# Patient Record
Sex: Male | Born: 1945 | Race: White | Hispanic: No | Smoking: Never smoker
Health system: Southern US, Community
[De-identification: ages and names within clinical notes are randomized; demographics above are authoritative.]

## PROBLEM LIST (undated history)

## (undated) DIAGNOSIS — G8929 Other chronic pain: Secondary | ICD-10-CM

## (undated) DIAGNOSIS — K219 Gastro-esophageal reflux disease without esophagitis: Secondary | ICD-10-CM

## (undated) DIAGNOSIS — C801 Malignant (primary) neoplasm, unspecified: Secondary | ICD-10-CM

## (undated) DIAGNOSIS — Z8781 Personal history of (healed) traumatic fracture: Secondary | ICD-10-CM

## (undated) DIAGNOSIS — N189 Chronic kidney disease, unspecified: Secondary | ICD-10-CM

## (undated) DIAGNOSIS — R972 Elevated prostate specific antigen [PSA]: Secondary | ICD-10-CM

## (undated) DIAGNOSIS — R001 Bradycardia, unspecified: Secondary | ICD-10-CM

## (undated) DIAGNOSIS — K5790 Diverticulosis of intestine, part unspecified, without perforation or abscess without bleeding: Secondary | ICD-10-CM

## (undated) DIAGNOSIS — M1990 Unspecified osteoarthritis, unspecified site: Secondary | ICD-10-CM

## (undated) DIAGNOSIS — M25559 Pain in unspecified hip: Secondary | ICD-10-CM

## (undated) DIAGNOSIS — N400 Enlarged prostate without lower urinary tract symptoms: Secondary | ICD-10-CM

## (undated) HISTORY — PX: PROSTATE BIOPSY: SHX241

## (undated) HISTORY — PX: COLONOSCOPY: SHX174

## (undated) HISTORY — PX: TONSILLECTOMY: SUR1361

---

## 2016-10-29 ENCOUNTER — Emergency Department (HOSPITAL_BASED_OUTPATIENT_CLINIC_OR_DEPARTMENT_OTHER): Payer: Medicare FFS

## 2016-10-29 ENCOUNTER — Emergency Department (HOSPITAL_BASED_OUTPATIENT_CLINIC_OR_DEPARTMENT_OTHER)
Admission: EM | Admit: 2016-10-29 | Discharge: 2016-10-29 | Disposition: A | Discharge status: Home / Self Care | Payer: Medicare FFS | Attending: Emergency Medicine | Admitting: Emergency Medicine

## 2016-10-29 ENCOUNTER — Encounter (HOSPITAL_BASED_OUTPATIENT_CLINIC_OR_DEPARTMENT_OTHER): Payer: Self-pay | Admitting: Emergency Medicine

## 2016-10-29 DIAGNOSIS — R0602 Shortness of breath: Secondary | ICD-10-CM | POA: Insufficient documentation

## 2016-10-29 DIAGNOSIS — R079 Chest pain, unspecified: Secondary | ICD-10-CM

## 2016-10-29 DIAGNOSIS — Z79899 Other long term (current) drug therapy: Secondary | ICD-10-CM | POA: Insufficient documentation

## 2016-10-29 DIAGNOSIS — R42 Dizziness and giddiness: Secondary | ICD-10-CM | POA: Diagnosis not present

## 2016-10-29 HISTORY — DX: Other chronic pain: G89.29

## 2016-10-29 HISTORY — DX: Pain in unspecified hip: M25.559

## 2016-10-29 LAB — CBC
HCT: 40.5 % (ref 39.0–52.0)
Hemoglobin: 14.4 g/dL (ref 13.0–17.0)
MCH: 32.4 pg (ref 26.0–34.0)
MCHC: 35.6 g/dL (ref 30.0–36.0)
MCV: 91.2 fL (ref 78.0–100.0)
PLATELETS: 283 10*3/uL (ref 150–400)
RBC: 4.44 MIL/uL (ref 4.22–5.81)
RDW: 12.1 % (ref 11.5–15.5)
WBC: 5.7 10*3/uL (ref 4.0–10.5)

## 2016-10-29 LAB — BASIC METABOLIC PANEL
Anion gap: 8 (ref 5–15)
BUN: 17 mg/dL (ref 6–20)
CALCIUM: 9 mg/dL (ref 8.9–10.3)
CO2: 25 mmol/L (ref 22–32)
CREATININE: 1.25 mg/dL — AB (ref 0.61–1.24)
Chloride: 105 mmol/L (ref 101–111)
GFR calc non Af Amer: 57 mL/min — ABNORMAL LOW (ref >60)
Glucose, Bld: 121 mg/dL — ABNORMAL HIGH (ref 65–99)
Potassium: 4.2 mmol/L (ref 3.5–5.1)
SODIUM: 138 mmol/L (ref 135–145)

## 2016-10-29 LAB — D-DIMER, QUANTITATIVE: D-Dimer, Quant: 0.56 ug/mL-FEU — ABNORMAL HIGH (ref 0.00–0.50)

## 2016-10-29 LAB — TROPONIN I: Troponin I: <0.03 ng/mL (ref <0.03)

## 2016-10-29 MED ORDER — IOPAMIDOL (ISOVUE-370) INJECTION 76%
100.0000 mL | Freq: Once | INTRAVENOUS | Status: AC | PRN
  Administered 2016-10-29: 100 mL via INTRAVENOUS

## 2016-10-29 NOTE — ED Triage Notes (Signed)
Patient states that he has has middle to lower back pain and hip pain chronically, but started to have chest pain to his left side under the nipple and SOB with an episode of Dizziness about 2 days ago

## 2016-10-29 NOTE — ED Notes (Signed)
Patient transported to CT 

## 2016-10-29 NOTE — ED Notes (Signed)
ED Provider at bedside. Discussed test results and plan of care to recheck Trop at 1715.

## 2016-10-30 NOTE — ED Provider Notes (Signed)
Received care of patient from Dr. Venora Maples at [facility]. Please see his note for prior history and physical. Briefly, this is a 70yo male who presents with concern for chest pain. Pain present with deep breaths, notices it when standing. No exertional CP history prior to this. No hx of cardiac disease.  ECG, initial troponin negative. DDimer elevated and pt with hx of travel from [facility] and CT PE study ordered and delta trop pending.   CT PE study negative. Delta troponin negative, doubt ACS. PT CP free. Will provide number for cardiology follow up here. Patient discharged in stable condition with understanding of reasons to return.      EKG Interpretation  Date/Time:  Monday October 29 2016 14:01:40 EST Ventricular Rate:  69 PR Interval:    QRS Duration: 92 QT Interval:  386 QTC Calculation: 414 R Axis:   -11 Text Interpretation:  Sinus rhythm Abnormal R-wave progression, early transition No old tracing to compare Confirmed by CAMPOS  MD, KEVIN ([id number]) on 10/29/2016 2:09:49 PM         Gareth Morgan, MD 10/30/16 1213

## 2016-10-30 NOTE — ED Provider Notes (Signed)
[facility] DEPT [facility] Provider Note   CSN: [id number] Arrival date & time: 10/29/16  1355     History   Chief Complaint Chief Complaint  Patient presents with  . Chest Pain    HPI Luke Becker is a 70 y.o. male.   Chest Pain     Patient presents to the emergency department with mild chest pain and pain under his left nipple.  He also reports some occasional shortness of breath and dizziness.  He just came from [place] and had a 61 Hour Dr.  No history of cardiac disease.  No prior history of DVT or pulmonary embolism.  He denies unilateral leg swelling.  No recent cough or congestion.  No fevers or chills.  No shortness of breath at rest.  He is always been active through his life.  He used to run marathons.  He does not currently have any exertional chest pain or shortness of breath   Past Medical History:  Diagnosis Date  . Chronic hip pain   . Prostate enlargement     There are no active problems to display for this patient.   Past Surgical History:  Procedure Laterality Date  . TONSILLECTOMY         Home Medications    Prior to Admission medications   Medication Sig Start Date End Date Taking? Authorizing Provider  doxazosin (CARDURA) 4 MG tablet Take 4 mg by mouth daily.   Yes Historical Provider, MD  finasteride (PROSCAR) 5 MG tablet Take 5 mg by mouth daily.   Yes Historical Provider, MD  meloxicam (MOBIC) 7.5 MG tablet Take 7.5 mg by mouth daily.   Yes Historical Provider, MD    Family History History reviewed. No pertinent family history.  Social History Social History  Substance Use Topics  . Smoking status: Never Smoker  . Smokeless tobacco: Never Used  . Alcohol use 1.2 oz/week    2 Glasses of wine per week     Allergies   Patient has no known allergies.   Review of Systems Review of Systems  Cardiovascular: Positive for chest pain.  All other systems reviewed and are negative.    Physical Exam Updated Vital Signs BP  132/82 (BP Location: Right Arm)   Pulse 62   Temp 98.2 F (36.8 C) (Oral)   Resp 16   Ht 5\' 9"  (1.753 m)   Wt 185 lb (83.9 kg)   SpO2 97%   BMI 27.32 kg/m   Physical Exam  Constitutional: He is oriented to person, place, and time. He appears well-developed and well-nourished.  HENT:  Head: Normocephalic and atraumatic.  Eyes: EOM are normal.  Neck: Normal range of motion.  Cardiovascular: Normal rate, regular rhythm, normal heart sounds and intact distal pulses.   Pulmonary/Chest: Effort normal and breath sounds normal. No respiratory distress.  Abdominal: Soft. He exhibits no distension. There is no tenderness.  Musculoskeletal: Normal range of motion.  Neurological: He is alert and oriented to person, place, and time.  Skin: Skin is warm and dry.  Psychiatric: He has a normal mood and affect. Judgment normal.  Nursing note and vitals reviewed.    ED Treatments / Results  Labs (all labs ordered are listed, but only abnormal results are displayed) Labs Reviewed  BASIC METABOLIC PANEL - Abnormal; Notable for the following:       Result Value   Glucose, Bld 121 (*)    Creatinine, Ser 1.25 (*)    GFR calc non Af Amer 57 (*)  All other components within normal limits  D-DIMER, QUANTITATIVE (NOT AT [facility]) - Abnormal; Notable for the following:    D-Dimer, Quant 0.56 (*)    All other components within normal limits  CBC  TROPONIN I  TROPONIN I    EKG  EKG Interpretation  Date/Time:  Monday October 29 2016 14:01:40 EST Ventricular Rate:  69 PR Interval:    QRS Duration: 92 QT Interval:  386 QTC Calculation: 414 R Axis:   -11 Text Interpretation:  Sinus rhythm Abnormal R-wave progression, early transition No old tracing to compare Confirmed by Clifton Kovacic  MD, Lennette Bihari ([id number]) on 10/29/2016 2:09:49 PM       Radiology Dg Chest 2 View  Result Date: 10/29/2016 CLINICAL DATA:  Initial evaluation for acute left-sided chest pain. EXAM: CHEST  2 VIEW COMPARISON:  None  available. FINDINGS: Cardiac and mediastinal silhouettes within normal limits. Lungs are normally inflated. No focal infiltrates, pulmonary edema, or pleural effusion. No acute osseus abnormality. Severe degenerative changes noted about the right shoulder. Degenerative spurring noted within the visualized spine. IMPRESSION: No active cardiopulmonary disease. Electronically Signed   By: Jeannine Boga M.D.   On: 10/29/2016 14:44     Procedures Procedures (including critical care time)  Medications Ordered in ED Medications  iopamidol (ISOVUE-370) 76 % injection 100 mL (100 mLs Intravenous Contrast Given 10/29/16 1551)     Initial Impression / Assessment and Plan / ED Course  I have reviewed the triage vital signs and the nursing notes.  Pertinent labs & imaging results that were available during my care of the patient were reviewed by me and considered in my medical decision making (see chart for details).  Clinical Course     Low suspicion for ACS.  EKG and troponin negative.  Patient will undergo CT imaging of his chest given his elevated d-dimer.  Care to Dr. Billy Fischer to follow up on second troponin and CTA chest  Final Clinical Impressions(s) / ED Diagnoses   Final diagnoses:  Chest pain, unspecified type    New Prescriptions Discharge Medication List as of 10/29/2016  6:21 PM       Jola Schmidt, MD 10/30/16 [phone]

## 2017-12-03 HISTORY — PX: CARPAL TUNNEL RELEASE: SHX101

## 2017-12-13 ENCOUNTER — Ambulatory Visit: Payer: Self-pay | Admitting: Orthopedic Surgery

## 2017-12-17 ENCOUNTER — Ambulatory Visit: Payer: Self-pay | Admitting: Orthopedic Surgery

## 2017-12-17 NOTE — H&P (View-Only) (Signed)
Luke Becker, LY [phone], M)  DOB 09/05/1946   Chief Complaint Right Hip Pain right THA scheduled 12/23/17  Patient's Care Team Primary Care Provider: [facility]): [address], [place], [place] [address], Ph [phone], Fax [phone]    Patient's Pharmacies [place] [address] [facility]): [address], [place] [address], Ph [phone], Fax [phone]   Vitals Ht: 5 ft 9 in 12/17/2017 10:45 am Wt: 180 lbs 12/17/2017 10:45 am BMI: 26.6 12/17/2017 10:45 am Pain Scale: 8 12/17/2017 10:47 am   Allergies Reviewed Allergies NKDA   Medications Reviewed Medications doxazosin 4 mg tablet  finasteride 5 mg tablet  IBU 600 mg tablet  pantoprazole 40 mg tablet,delayed release     Problems Reviewed Problems Osteoarthritis of hip    Family History Reviewed Family History Father - No current problems or disability Mother - No current problems or disability   Social History Reviewed Social History Smoking Status: Never smoker Alcohol intake: Occasional Hand Dominance: Right Work related injury?: N Advance directive: N Medical Power of Attorney: N   Surgical History Reviewed Surgical History Cataract Tonsillectomy - 12/03/1952   Past Medical History Reviewed Past Medical History Joint Pain: Y Osteoarthritis: Y Notes: Ventral Hernia, Umbilical Hernia, Left Rotator Cuff Tear    HPI Patient is a 72 year old male admitted for scheduled right total hip  Patient has been followed for his right hip. He reports 1 1/2 years of progressive and and dysfunction. He reports aching (groin and buttock). He reports pain level 8/10. He describes pain with weightbearing activity.  He has had progressively worsening problems with this RIGHT hip especially within the past 6 months. The hip is now hurting at all times and limiting what he can and cannot do. He has lost range of motion and is having pain day and night. He  is having significant functional difficulties also. He was told he has significant arthritis in the hip by an orthopedist in [place] and hip replacement was recommended. He has family here in [place] and is moving back here. He is not having any current problems with the LEFT hip. He is ready at this time to get th hip replaced. Risks and benefits have been discussed with the patient and he is ready to proceed with surgery.   ROS Constitutional: Constitutional: no significant weight gain or loss and no fever.  HEENT: Eyes: no irritation, dry eyes, vision change, or sore throat.  Cardiovascular: Cardiovascular: no palpitations or chest pain.  Respiratory: Respiratory: no cough or shortness of breath and No COPD.  Gastrointestinal: Gastrointestinal: no vomiting or diarrhea and not vomiting blood.  Genitourinary: Genitourinary: no blood in urine or difficulty urinating.  Musculoskeletal: Musculoskeletal: Joint Pain.  Neurologic: Neurologic: no numbness, seizures, dizziness, or difficulty with balance.     Physical Exam Patient is a 72 year old male.  General Mental Status - Alert, cooperative and good historian. General Appearance - pleasant, Not in acute distress. Orientation - Oriented X3. Build & Nutrition - Well nourished and Well developed.  Head and Neck Head - normocephalic, atraumatic . Neck Global Assessment - supple, no bruit auscultated on the right, no bruit auscultated on the left.  Eye Pupil - Bilateral - PERR Motion - Bilateral - EOMI.  Chest and Lung Exam Auscultation Breath sounds - clear at anterior chest wall and clear at posterior chest wall. Adventitious sounds - No Adventitious sounds.  Cardiovascular Auscultation Rhythm - Regular rate and rhythm. Heart Sounds - S1  WNL and S2 WNL. Murmurs & Other Heart Sounds - Auscultation of the heart reveals - No Murmurs.  Abdomen Palpation/Percussion Tenderness - Abdomen is non-tender to palpation. Abdomen  is soft. Auscultation Auscultation of the abdomen reveals - Bowel sounds normal.  Male Genitourinary Note: Not done, not pertinent to present illness  Musculoskeletal His LEFT hip can be flexed to 110, rotated and 20, out 30 and abduct to 30 with minimal discomfort. His RIGHT hip can be flexed to 100 with no internal or external rotation and only 10 of abduction. He has no trochanteric tenderness. He has a significantly antalgic gait pattern on the RIGHT. Pulses, sensation and motor are intact both lower extremities.  Radiographs-AP pelvis and lateral of the RIGHT hip show advanced end-stage bone-on-bone arthritis of the RIGHT hip with subchondral cystic formation. He has mild arthritis on the LEFT.   Assessment / Plan 1. 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 Jabier Gauss [facility])  IV TXA  Anesthesia Issues: None  Patient was instructed on what medications to stop prior to surgery.  Return to Office [name], MD for Post-Op at [facility] on 01/07/2018 at 02:15 PM  Encounter signed-off by Mickel Crow, PA-C

## 2017-12-17 NOTE — Patient Instructions (Signed)
Undrea Shipes  12/17/2017   Your procedure is scheduled on: 12/23/2017  Report to [facility] Main  Entrance  Report to admitting at   0800 AM   Call this number if you have problems the morning of surgery [phone]   Remember: Do not eat food or drink liquids :After Midnight.     Take these medicines the morning of surgery with A SIP OF WATER: Doxazosin, proscar, Protonix                                 You may not have any metal on your body including hair pins and              piercings  Do not wear jewelry, , lotions, powders or perfumes, deodorant                          Men may shave face and neck.   Do not bring valuables to the hospital. [place].  Contacts, dentures or bridgework may not be worn into surgery.  Leave suitcase in the car. After surgery it may be brought to your room.                       Please read over the following fact sheets you were given: _____________________________________________________________________             [facility] - Preparing for Surgery Before surgery, you can play an important role.  Because skin is not sterile, your skin needs to be as free of germs as possible.  You can reduce the number of germs on your skin by washing with CHG (chlorahexidine gluconate) soap before surgery.  CHG is an antiseptic cleaner which kills germs and bonds with the skin to continue killing germs even after washing. Please DO NOT use if you have an allergy to CHG or antibacterial soaps.  If your skin becomes reddened/irritated stop using the CHG and inform your nurse when you arrive at Short Stay. Do not shave (including legs and underarms) for at least 48 hours prior to the first CHG shower.  You may shave your face/neck. Please follow these instructions carefully:  1.  Shower with CHG Soap the night before surgery and the  morning of Surgery.  2.  If you choose to wash  your hair, wash your hair first as usual with your  normal  shampoo.  3.  After you shampoo, rinse your hair and body thoroughly to remove the  shampoo.                           4.  Use CHG as you would any other liquid soap.  You can apply chg directly  to the skin and wash                       Gently with a scrungie or clean washcloth.  5.  Apply the CHG Soap to your body ONLY FROM THE NECK DOWN.   Do not use on face/ open  Wound or open sores. Avoid contact with eyes, ears mouth and genitals (private parts).                       Wash face,  Genitals (private parts) with your normal soap.             6.  Wash thoroughly, paying special attention to the area where your surgery  will be performed.  7.  Thoroughly rinse your body with warm water from the neck down.  8.  DO NOT shower/wash with your normal soap after using and rinsing off  the CHG Soap.                9.  Pat yourself dry with a clean towel.            10.  Wear clean pajamas.            11.  Place clean sheets on your bed the night of your first shower and do not  sleep with pets. Day of Surgery : Do not apply any lotions/deodorants the morning of surgery.  Please wear clean clothes to the hospital/surgery center.  FAILURE TO FOLLOW THESE INSTRUCTIONS MAY RESULT IN THE CANCELLATION OF YOUR SURGERY PATIENT SIGNATURE_________________________________  NURSE SIGNATURE__________________________________  ________________________________________________________________________  WHAT IS A BLOOD TRANSFUSION? Blood Transfusion Information  A transfusion is the replacement of blood or some of its parts. Blood is made up of multiple cells which provide different functions.  Red blood cells carry oxygen and are used for blood loss replacement.  White blood cells fight against infection.  Platelets control bleeding.  Plasma helps clot blood.  Other blood products are available for specialized needs,  such as hemophilia or other clotting disorders. BEFORE THE TRANSFUSION  Who gives blood for transfusions?   Healthy volunteers who are fully evaluated to make sure their blood is safe. This is blood bank blood. Transfusion therapy is the safest it has ever been in the practice of medicine. Before blood is taken from a donor, a complete history is taken to make sure that person has no history of diseases nor engages in risky social behavior (examples are intravenous drug use or sexual activity with multiple partners). The donor's travel history is screened to minimize risk of transmitting infections, such as malaria. The donated blood is tested for signs of infectious diseases, such as HIV and hepatitis. The blood is then tested to be sure it is compatible with you in order to minimize the chance of a transfusion reaction. If you or a relative donates blood, this is often done in anticipation of surgery and is not appropriate for emergency situations. It takes many days to process the donated blood. RISKS AND COMPLICATIONS Although transfusion therapy is very safe and saves many lives, the main dangers of transfusion include:   Getting an infectious disease.  Developing a transfusion reaction. This is an allergic reaction to something in the blood you were given. Every precaution is taken to prevent this. The decision to have a blood transfusion has been considered carefully by your caregiver before blood is given. Blood is not given unless the benefits outweigh the risks. AFTER THE TRANSFUSION  Right after receiving a blood transfusion, you will usually feel much better and more energetic. This is especially true if your red blood cells have gotten low (anemic). The transfusion raises the level of the red blood cells which carry oxygen, and this usually causes an energy increase.  The  nurse administering the transfusion will monitor you carefully for complications. HOME CARE INSTRUCTIONS  No  special instructions are needed after a transfusion. You may find your energy is better. Speak with your caregiver about any limitations on activity for underlying diseases you may have. SEEK MEDICAL CARE IF:   Your condition is not improving after your transfusion.  You develop redness or irritation at the intravenous (IV) site. SEEK IMMEDIATE MEDICAL CARE IF:  Any of the following symptoms occur over the next 12 hours:  Shaking chills.  You have a temperature by mouth above 102 F (38.9 C), not controlled by medicine.  Chest, back, or muscle pain.  People around you feel you are not acting correctly or are confused.  Shortness of breath or difficulty breathing.  Dizziness and fainting.  You get a rash or develop hives.  You have a decrease in urine output.  Your urine turns a dark color or changes to pink, red, or brown. Any of the following symptoms occur over the next 10 days:  You have a temperature by mouth above 102 F (38.9 C), not controlled by medicine.  Shortness of breath.  Weakness after normal activity.  The white part of the eye turns yellow (jaundice).  You have a decrease in the amount of urine or are urinating less often.  Your urine turns a dark color or changes to pink, red, or brown. Document Released: 11/16/2000 Document Revised: 02/11/2012 Document Reviewed: 07/05/2008 ExitCare Patient Information 2014 [place].  _______________________________________________________________________  Incentive Spirometer  An incentive spirometer is a tool that can help keep your lungs clear and active. This tool measures how well you are filling your lungs with each breath. Taking long deep breaths may help reverse or decrease the chance of developing breathing (pulmonary) problems (especially infection) following:  A long period of time when you are unable to move or be active. BEFORE THE PROCEDURE   If the spirometer includes an indicator to show  your best effort, your nurse or respiratory therapist will set it to a desired goal.  If possible, sit up straight or lean slightly forward. Try not to slouch.  Hold the incentive spirometer in an upright position. INSTRUCTIONS FOR USE  1. Sit on the edge of your bed if possible, or sit up as far as you can in bed or on a chair. 2. Hold the incentive spirometer in an upright position. 3. Breathe out normally. 4. Place the mouthpiece in your mouth and seal your lips tightly around it. 5. Breathe in slowly and as deeply as possible, raising the piston or the ball toward the top of the column. 6. Hold your breath for 3-5 seconds or for as long as possible. Allow the piston or ball to fall to the bottom of the column. 7. Remove the mouthpiece from your mouth and breathe out normally. 8. Rest for a few seconds and repeat Steps 1 through 7 at least 10 times every 1-2 hours when you are awake. Take your time and take a few normal breaths between deep breaths. 9. The spirometer may include an indicator to show your best effort. Use the indicator as a goal to work toward during each repetition. 10. After each set of 10 deep breaths, practice coughing to be sure your lungs are clear. If you have an incision (the cut made at the time of surgery), support your incision when coughing by placing a pillow or rolled up towels firmly against it. Once you are able to get  out of bed, walk around indoors and cough well. You may stop using the incentive spirometer when instructed by your caregiver.  RISKS AND COMPLICATIONS  Take your time so you do not get dizzy or light-headed.  If you are in pain, you may need to take or ask for pain medication before doing incentive spirometry. It is harder to take a deep breath if you are having pain. AFTER USE  Rest and breathe slowly and easily.  It can be helpful to keep track of a log of your progress. Your caregiver can provide you with a simple table to help with  this. If you are using the spirometer at home, follow these instructions: [place] IF:   You are having difficultly using the spirometer.  You have trouble using the spirometer as often as instructed.  Your pain medication is not giving enough relief while using the spirometer.  You develop fever of 100.5 F (38.1 C) or higher. SEEK IMMEDIATE MEDICAL CARE IF:   You cough up bloody sputum that had not been present before.  You develop fever of 102 F (38.9 C) or greater.  You develop worsening pain at or near the incision site. MAKE SURE YOU:   Understand these instructions.  Will watch your condition.  Will get help right away if you are not doing well or get worse. Document Released: 04/01/2007 Document Revised: 02/11/2012 Document Reviewed: 06/02/2007 [facility] Patient Information 2014 [place], [place].   ________________________________________________________________________

## 2017-12-17 NOTE — H&P (Signed)
Luke Becker, Luke Becker [phone], M)  DOB 02-05-1946   Chief Complaint Right Hip Pain right THA scheduled 12/23/17  Patient's Care Team Primary Care Provider: [facility]): [place], [place], [place] [address], Ph [phone], Fax [phone]    Patient's Pharmacies [place] [address] [facility]): [address], [place] [address], Ph [phone], Fax [phone]   Vitals Ht: 5 ft 9 in 12/17/2017 10:45 am Wt: 180 lbs 12/17/2017 10:45 am BMI: 26.6 12/17/2017 10:45 am Pain Scale: 8 12/17/2017 10:47 am   Allergies Reviewed Allergies NKDA   Medications Reviewed Medications doxazosin 4 mg tablet  finasteride 5 mg tablet  IBU 600 mg tablet  pantoprazole 40 mg tablet,delayed release     Problems Reviewed Problems Osteoarthritis of hip    Family History Reviewed Family History Father - No current problems or disability Mother - No current problems or disability   Social History Reviewed Social History Smoking Status: Never smoker Alcohol intake: Occasional Hand Dominance: Right Work related injury?: N Advance directive: N Medical Power of Attorney: N   Surgical History Reviewed Surgical History Cataract Tonsillectomy - 12/03/1952   Past Medical History Reviewed Past Medical History Joint Pain: Y Osteoarthritis: Y Notes: Ventral Hernia, Umbilical Hernia, Left Rotator Cuff Tear    HPI Patient is a 72 year old male admitted for scheduled right total hip  Patient has been followed for his right hip. He reports 1 1/2 years of progressive and and dysfunction. He reports aching (groin and buttock). He reports pain level 8/10. He describes pain with weightbearing activity.  He has had progressively worsening problems with this RIGHT hip especially within the past 6 months. The hip is now hurting at all times and limiting what he can and cannot do. He has lost range of motion and is having pain day and night. He  is having significant functional difficulties also. He was told he has significant arthritis in the hip by an orthopedist in [place] and hip replacement was recommended. He has family here in [place] and is moving back here. He is not having any current problems with the LEFT hip. He is ready at this time to get th hip replaced. Risks and benefits have been discussed with the patient and he is ready to proceed with surgery.   ROS Constitutional: Constitutional: no significant weight gain or loss and no fever.  HEENT: Eyes: no irritation, dry eyes, vision change, or sore throat.  Cardiovascular: Cardiovascular: no palpitations or chest pain.  Respiratory: Respiratory: no cough or shortness of breath and No COPD.  Gastrointestinal: Gastrointestinal: no vomiting or diarrhea and not vomiting blood.  Genitourinary: Genitourinary: no blood in urine or difficulty urinating.  Musculoskeletal: Musculoskeletal: Joint Pain.  Neurologic: Neurologic: no numbness, seizures, dizziness, or difficulty with balance.     Physical Exam Patient is a 72 year old male.  General Mental Status - Alert, cooperative and good historian. General Appearance - pleasant, Not in acute distress. Orientation - Oriented X3. Build & Nutrition - Well nourished and Well developed.  Head and Neck Head - normocephalic, atraumatic . Neck Global Assessment - supple, no bruit auscultated on the right, no bruit auscultated on the left.  Eye Pupil - Bilateral - PERR Motion - Bilateral - EOMI.  Chest and Lung Exam Auscultation Breath sounds - clear at anterior chest wall and clear at posterior chest wall. Adventitious sounds - No Adventitious sounds.  Cardiovascular Auscultation Rhythm - Regular rate and rhythm. Heart Sounds - S1  WNL and S2 WNL. Murmurs & Other Heart Sounds - Auscultation of the heart reveals - No Murmurs.  Abdomen Palpation/Percussion Tenderness - Abdomen is non-tender to palpation. Abdomen  is soft. Auscultation Auscultation of the abdomen reveals - Bowel sounds normal.  Male Genitourinary Note: Not done, not pertinent to present illness  Musculoskeletal His LEFT hip can be flexed to 110, rotated and 20, out 30 and abduct to 30 with minimal discomfort. His RIGHT hip can be flexed to 100 with no internal or external rotation and only 10 of abduction. He has no trochanteric tenderness. He has a significantly antalgic gait pattern on the RIGHT. Pulses, sensation and motor are intact both lower extremities.  Radiographs-AP pelvis and lateral of the RIGHT hip show advanced end-stage bone-on-bone arthritis of the RIGHT hip with subchondral cystic formation. He has mild arthritis on the LEFT.   Assessment / Plan 1. 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 Jabier Gauss [facility])  IV TXA  Anesthesia Issues: None  Patient was instructed on what medications to stop prior to surgery.  Return to Office [name], MD for Post-Op at [facility] on 01/07/2018 at 02:15 PM  Encounter signed-off by Mickel Crow, PA-C

## 2017-12-18 ENCOUNTER — Encounter (HOSPITAL_COMMUNITY): Payer: Self-pay

## 2017-12-18 ENCOUNTER — Other Ambulatory Visit: Payer: Self-pay

## 2017-12-18 ENCOUNTER — Encounter (INDEPENDENT_AMBULATORY_CARE_PROVIDER_SITE_OTHER): Payer: Self-pay

## 2017-12-18 ENCOUNTER — Encounter (HOSPITAL_COMMUNITY)
Admission: RE | Admit: 2017-12-18 | Discharge: 2017-12-18 | Disposition: A | Discharge status: Home / Self Care | Payer: Medicare PPO | Source: Ambulatory Visit | Attending: Orthopedic Surgery | Admitting: Orthopedic Surgery

## 2017-12-18 DIAGNOSIS — Z01812 Encounter for preprocedural laboratory examination: Secondary | ICD-10-CM | POA: Insufficient documentation

## 2017-12-18 DIAGNOSIS — M1611 Unilateral primary osteoarthritis, right hip: Secondary | ICD-10-CM | POA: Insufficient documentation

## 2017-12-18 LAB — SURGICAL PCR SCREEN
MRSA, PCR: NEGATIVE
STAPHYLOCOCCUS AUREUS: POSITIVE — AB

## 2017-12-18 LAB — PROTIME-INR
INR: 1.05
PROTHROMBIN TIME: 13.6 s (ref 11.4–15.2)

## 2017-12-18 LAB — APTT: aPTT: 36 seconds (ref 24–36)

## 2017-12-18 LAB — ABO/RH: ABO/RH(D): O POS

## 2017-12-18 NOTE — Progress Notes (Signed)
LOV-12/06/2017-with DR Williemae Natter on chart  CMP-12/13/17 on chart  CBc/DIFF-12/13/17-on chart  EKG-12/13/17-on chart

## 2017-12-22 MED ORDER — TRANEXAMIC ACID 1000 MG/10ML IV SOLN
1000.0000 mg | INTRAVENOUS | Status: AC
  Administered 2017-12-23: 1000 mg via INTRAVENOUS
  Filled 2017-12-22: qty 1100

## 2017-12-22 NOTE — Anesthesia Preprocedure Evaluation (Addendum)
Anesthesia Evaluation  Patient identified by MRN, date of birth, ID band Patient awake    Reviewed: Allergy & Precautions, NPO status , Patient's Chart, lab work & pertinent test results  Airway Mallampati: II  TM Distance: >3 FB Neck ROM: Full    Dental no notable dental hx.    Pulmonary neg pulmonary ROS,    Pulmonary exam normal breath sounds clear to auscultation       Cardiovascular negative cardio ROS Normal cardiovascular exam Rhythm:Regular Rate:Normal     Neuro/Psych negative neurological ROS  negative psych ROS   GI/Hepatic negative GI ROS, Neg liver ROS, GERD  Medicated,  Endo/Other  negative endocrine ROS  Renal/GU negative Renal ROS  negative genitourinary   Musculoskeletal negative musculoskeletal ROS (+)   Abdominal   Peds negative pediatric ROS (+)  Hematology negative hematology ROS (+)   Anesthesia Other Findings   Reproductive/Obstetrics negative OB ROS                            Anesthesia Physical Anesthesia Plan  ASA: II  Anesthesia Plan: Spinal   Post-op Pain Management:    Induction:   PONV Risk Score and Plan: 1 and Treatment may vary due to age or medical condition and Propofol infusion  Airway Management Planned: Nasal Cannula and Natural Airway  Additional Equipment:   Intra-op Plan:   Post-operative Plan:   Informed Consent: I have reviewed the patients History and Physical, chart, labs and discussed the procedure including the risks, benefits and alternatives for the proposed anesthesia with the patient or authorized representative who has indicated his/her understanding and acceptance.     Plan Discussed with: CRNA and Anesthesiologist  Anesthesia Plan Comments: (  )        Anesthesia Quick Evaluation

## 2017-12-23 ENCOUNTER — Inpatient Hospital Stay (HOSPITAL_COMMUNITY): Payer: Medicare PPO | Admitting: Anesthesiology

## 2017-12-23 ENCOUNTER — Inpatient Hospital Stay (HOSPITAL_COMMUNITY): Payer: Medicare PPO

## 2017-12-23 ENCOUNTER — Encounter (HOSPITAL_COMMUNITY): Payer: Self-pay

## 2017-12-23 ENCOUNTER — Other Ambulatory Visit: Payer: Self-pay

## 2017-12-23 ENCOUNTER — Inpatient Hospital Stay (HOSPITAL_COMMUNITY)
Admission: RE | Admit: 2017-12-23 | Discharge: 2017-12-25 | DRG: 470 | Disposition: A | Discharge status: Home Health | Payer: Medicare PPO | Source: Ambulatory Visit | Attending: Orthopedic Surgery | Admitting: Orthopedic Surgery

## 2017-12-23 ENCOUNTER — Encounter (HOSPITAL_COMMUNITY)
Admission: RE | Disposition: A | Discharge status: Home Health | Payer: Self-pay | Source: Ambulatory Visit | Attending: Orthopedic Surgery

## 2017-12-23 DIAGNOSIS — K219 Gastro-esophageal reflux disease without esophagitis: Secondary | ICD-10-CM | POA: Diagnosis present

## 2017-12-23 DIAGNOSIS — N400 Enlarged prostate without lower urinary tract symptoms: Secondary | ICD-10-CM | POA: Diagnosis present

## 2017-12-23 DIAGNOSIS — Z881 Allergy status to other antibiotic agents status: Secondary | ICD-10-CM

## 2017-12-23 DIAGNOSIS — M1611 Unilateral primary osteoarthritis, right hip: Secondary | ICD-10-CM | POA: Diagnosis present

## 2017-12-23 DIAGNOSIS — Z96649 Presence of unspecified artificial hip joint: Secondary | ICD-10-CM

## 2017-12-23 DIAGNOSIS — M169 Osteoarthritis of hip, unspecified: Secondary | ICD-10-CM | POA: Diagnosis present

## 2017-12-23 DIAGNOSIS — M25551 Pain in right hip: Secondary | ICD-10-CM | POA: Diagnosis present

## 2017-12-23 HISTORY — PX: TOTAL HIP ARTHROPLASTY: SHX124

## 2017-12-23 LAB — TYPE AND SCREEN
ABO/RH(D): O POS
Antibody Screen: NEGATIVE

## 2017-12-23 SURGERY — ARTHROPLASTY, HIP, TOTAL, ANTERIOR APPROACH
Anesthesia: Spinal | Site: Hip | Laterality: Right

## 2017-12-23 MED ORDER — ONDANSETRON HCL 4 MG/2ML IJ SOLN
INTRAMUSCULAR | Status: AC
  Filled 2017-12-23: qty 2

## 2017-12-23 MED ORDER — EPHEDRINE SULFATE-NACL 50-0.9 MG/10ML-% IV SOSY
PREFILLED_SYRINGE | INTRAVENOUS | Status: DC | PRN
  Administered 2017-12-23 (×5): 10 mg via INTRAVENOUS

## 2017-12-23 MED ORDER — FENTANYL CITRATE (PF) 100 MCG/2ML IJ SOLN: 50.0000 ug | INTRAMUSCULAR | Status: DC

## 2017-12-23 MED ORDER — MENTHOL 3 MG MT LOZG: 1.0000 | LOZENGE | OROMUCOSAL | Status: DC | PRN

## 2017-12-23 MED ORDER — SODIUM CHLORIDE 0.9 % IV SOLN
1000.0000 mg | Freq: Once | INTRAVENOUS | Status: AC
  Administered 2017-12-23: 15:00:00 1000 mg via INTRAVENOUS
  Filled 2017-12-23: qty 1100

## 2017-12-23 MED ORDER — POLYETHYLENE GLYCOL 3350 17 G PO PACK: 17.0000 g | PACK | Freq: Every day | ORAL | Status: DC | PRN

## 2017-12-23 MED ORDER — ACETAMINOPHEN 10 MG/ML IV SOLN
1000.0000 mg | Freq: Once | INTRAVENOUS | Status: AC
  Administered 2017-12-23: 1000 mg via INTRAVENOUS
  Filled 2017-12-23: qty 100

## 2017-12-23 MED ORDER — FENTANYL CITRATE (PF) 100 MCG/2ML IJ SOLN
INTRAMUSCULAR | Status: AC
  Filled 2017-12-23: qty 2

## 2017-12-23 MED ORDER — BUPIVACAINE HCL (PF) 0.25 % IJ SOLN
INTRAMUSCULAR | Status: AC
  Filled 2017-12-23: qty 30

## 2017-12-23 MED ORDER — EPHEDRINE 5 MG/ML INJ
INTRAVENOUS | Status: AC
  Filled 2017-12-23: qty 10

## 2017-12-23 MED ORDER — DEXAMETHASONE SODIUM PHOSPHATE 10 MG/ML IJ SOLN
INTRAMUSCULAR | Status: AC
  Filled 2017-12-23: qty 1

## 2017-12-23 MED ORDER — FENTANYL CITRATE (PF) 100 MCG/2ML IJ SOLN
25.0000 ug | INTRAMUSCULAR | Status: DC | PRN
  Administered 2017-12-23 (×2): 50 ug via INTRAVENOUS

## 2017-12-23 MED ORDER — ONDANSETRON HCL 4 MG/2ML IJ SOLN
INTRAMUSCULAR | Status: DC | PRN
  Administered 2017-12-23: 4 mg via INTRAVENOUS

## 2017-12-23 MED ORDER — MIDAZOLAM HCL 5 MG/5ML IJ SOLN
INTRAMUSCULAR | Status: DC | PRN
  Administered 2017-12-23: 2 mg via INTRAVENOUS

## 2017-12-23 MED ORDER — MORPHINE SULFATE (PF) 4 MG/ML IV SOLN
1.0000 mg | INTRAVENOUS | Status: DC | PRN
  Administered 2017-12-23: 1 mg via INTRAVENOUS
  Filled 2017-12-23: qty 1

## 2017-12-23 MED ORDER — TRAMADOL HCL 50 MG PO TABS: 50.0000 mg | ORAL_TABLET | Freq: Four times a day (QID) | ORAL | Status: DC | PRN

## 2017-12-23 MED ORDER — PANTOPRAZOLE SODIUM 40 MG PO TBEC: 40.0000 mg | DELAYED_RELEASE_TABLET | Freq: Every day | ORAL | Status: DC | PRN

## 2017-12-23 MED ORDER — ACETAMINOPHEN 500 MG PO TABS
1000.0000 mg | ORAL_TABLET | Freq: Four times a day (QID) | ORAL | Status: AC
  Administered 2017-12-23 – 2017-12-24 (×4): 1000 mg via ORAL
  Filled 2017-12-23 (×4): qty 2

## 2017-12-23 MED ORDER — METHOCARBAMOL 500 MG PO TABS
500.0000 mg | ORAL_TABLET | Freq: Four times a day (QID) | ORAL | Status: DC | PRN
  Administered 2017-12-23 – 2017-12-24 (×3): 500 mg via ORAL
  Filled 2017-12-23 (×3): qty 1

## 2017-12-23 MED ORDER — FLEET ENEMA 7-19 GM/118ML RE ENEM: 1.0000 | ENEMA | Freq: Once | RECTAL | Status: DC | PRN

## 2017-12-23 MED ORDER — PHENOL 1.4 % MT LIQD
1.0000 | OROMUCOSAL | Status: DC | PRN
  Filled 2017-12-23: qty 177

## 2017-12-23 MED ORDER — MIDAZOLAM HCL 2 MG/2ML IJ SOLN
INTRAMUSCULAR | Status: AC
  Filled 2017-12-23: qty 2

## 2017-12-23 MED ORDER — DOCUSATE SODIUM 100 MG PO CAPS
100.0000 mg | ORAL_CAPSULE | Freq: Two times a day (BID) | ORAL | Status: DC
  Administered 2017-12-23 – 2017-12-25 (×4): 100 mg via ORAL
  Filled 2017-12-23 (×4): qty 1

## 2017-12-23 MED ORDER — MEPERIDINE HCL 50 MG/ML IJ SOLN: 6.2500 mg | INTRAMUSCULAR | Status: DC | PRN

## 2017-12-23 MED ORDER — 0.9 % SODIUM CHLORIDE (POUR BTL) OPTIME
TOPICAL | Status: DC | PRN
  Administered 2017-12-23: 1000 mL

## 2017-12-23 MED ORDER — BISACODYL 10 MG RE SUPP: 10.0000 mg | Freq: Every day | RECTAL | Status: DC | PRN

## 2017-12-23 MED ORDER — PHENYLEPHRINE 40 MCG/ML (10ML) SYRINGE FOR IV PUSH (FOR BLOOD PRESSURE SUPPORT)
PREFILLED_SYRINGE | INTRAVENOUS | Status: DC | PRN
  Administered 2017-12-23: 80 ug via INTRAVENOUS
  Administered 2017-12-23: 40 ug via INTRAVENOUS
  Administered 2017-12-23: 120 ug via INTRAVENOUS
  Administered 2017-12-23: 40 ug via INTRAVENOUS
  Administered 2017-12-23: 80 ug via INTRAVENOUS

## 2017-12-23 MED ORDER — BUPIVACAINE HCL (PF) 0.25 % IJ SOLN
INTRAMUSCULAR | Status: DC | PRN
  Administered 2017-12-23: 30 mL

## 2017-12-23 MED ORDER — PROPOFOL 10 MG/ML IV BOLUS
INTRAVENOUS | Status: AC
  Filled 2017-12-23: qty 20

## 2017-12-23 MED ORDER — OXYCODONE HCL 5 MG PO TABS
5.0000 mg | ORAL_TABLET | ORAL | Status: DC | PRN
  Administered 2017-12-24: 5 mg via ORAL
  Filled 2017-12-23: qty 1

## 2017-12-23 MED ORDER — LACTATED RINGERS IV SOLN
INTRAVENOUS | Status: DC
  Administered 2017-12-23 (×2): via INTRAVENOUS

## 2017-12-23 MED ORDER — STERILE WATER FOR IRRIGATION IR SOLN
Status: DC | PRN
  Administered 2017-12-23: 2000 mL

## 2017-12-23 MED ORDER — BUPIVACAINE IN DEXTROSE 0.75-8.25 % IT SOLN
INTRATHECAL | Status: DC | PRN
Start: 2017-12-23 — End: 2017-12-23
  Administered 2017-12-23: 2 mL via INTRATHECAL

## 2017-12-23 MED ORDER — METOCLOPRAMIDE HCL 5 MG PO TABS: 5.0000 mg | ORAL_TABLET | Freq: Three times a day (TID) | ORAL | Status: DC | PRN

## 2017-12-23 MED ORDER — METOCLOPRAMIDE HCL 5 MG/ML IJ SOLN: 5.0000 mg | Freq: Three times a day (TID) | INTRAMUSCULAR | Status: DC | PRN

## 2017-12-23 MED ORDER — PROPOFOL 10 MG/ML IV BOLUS
INTRAVENOUS | Status: DC | PRN
  Administered 2017-12-23 (×2): 20 mg via INTRAVENOUS

## 2017-12-23 MED ORDER — DEXAMETHASONE SODIUM PHOSPHATE 10 MG/ML IJ SOLN
10.0000 mg | Freq: Once | INTRAMUSCULAR | Status: AC
  Administered 2017-12-24: 09:00:00 10 mg via INTRAVENOUS
  Filled 2017-12-23: qty 1

## 2017-12-23 MED ORDER — DIPHENHYDRAMINE HCL 12.5 MG/5ML PO ELIX: 12.5000 mg | ORAL_SOLUTION | ORAL | Status: DC | PRN

## 2017-12-23 MED ORDER — FINASTERIDE 5 MG PO TABS
5.0000 mg | ORAL_TABLET | Freq: Every day | ORAL | Status: DC
  Administered 2017-12-24 – 2017-12-25 (×2): 5 mg via ORAL
  Filled 2017-12-23 (×2): qty 1

## 2017-12-23 MED ORDER — ONDANSETRON HCL 4 MG/2ML IJ SOLN: 4.0000 mg | Freq: Four times a day (QID) | INTRAMUSCULAR | Status: DC | PRN

## 2017-12-23 MED ORDER — ACETAMINOPHEN 325 MG PO TABS
650.0000 mg | ORAL_TABLET | ORAL | Status: DC | PRN
  Administered 2017-12-25: 650 mg via ORAL
  Filled 2017-12-23: qty 2

## 2017-12-23 MED ORDER — DEXAMETHASONE SODIUM PHOSPHATE 10 MG/ML IJ SOLN
10.0000 mg | Freq: Once | INTRAMUSCULAR | Status: AC
  Administered 2017-12-23: 10 mg via INTRAVENOUS

## 2017-12-23 MED ORDER — ACETAMINOPHEN 650 MG RE SUPP
650.0000 mg | RECTAL | Status: DC | PRN
Start: 2017-12-24 — End: 2017-12-25

## 2017-12-23 MED ORDER — PROPOFOL 500 MG/50ML IV EMUL
INTRAVENOUS | Status: DC | PRN
  Administered 2017-12-23: 100 ug/kg/min via INTRAVENOUS

## 2017-12-23 MED ORDER — MIDAZOLAM HCL 2 MG/2ML IJ SOLN: 1.0000 mg | INTRAMUSCULAR | Status: DC

## 2017-12-23 MED ORDER — RIVAROXABAN 10 MG PO TABS
10.0000 mg | ORAL_TABLET | Freq: Every day | ORAL | Status: DC
  Administered 2017-12-24 – 2017-12-25 (×2): 10 mg via ORAL
  Filled 2017-12-23 (×2): qty 1

## 2017-12-23 MED ORDER — CEFAZOLIN SODIUM-DEXTROSE 2-4 GM/100ML-% IV SOLN
2.0000 g | Freq: Four times a day (QID) | INTRAVENOUS | Status: AC
  Administered 2017-12-23 (×2): 2 g via INTRAVENOUS
  Filled 2017-12-23 (×2): qty 100

## 2017-12-23 MED ORDER — OXYCODONE HCL 5 MG PO TABS
10.0000 mg | ORAL_TABLET | ORAL | Status: DC | PRN
  Administered 2017-12-23 – 2017-12-24 (×5): 10 mg via ORAL
  Filled 2017-12-23 (×5): qty 2

## 2017-12-23 MED ORDER — PHENYLEPHRINE 40 MCG/ML (10ML) SYRINGE FOR IV PUSH (FOR BLOOD PRESSURE SUPPORT)
PREFILLED_SYRINGE | INTRAVENOUS | Status: AC
  Filled 2017-12-23: qty 10

## 2017-12-23 MED ORDER — FENTANYL CITRATE (PF) 100 MCG/2ML IJ SOLN
INTRAMUSCULAR | Status: DC | PRN
  Administered 2017-12-23: 25 ug via INTRAVENOUS

## 2017-12-23 MED ORDER — LACTATED RINGERS IV SOLN: INTRAVENOUS | Status: DC

## 2017-12-23 MED ORDER — CHLORHEXIDINE GLUCONATE 4 % EX LIQD: 60.0000 mL | Freq: Once | CUTANEOUS | Status: DC

## 2017-12-23 MED ORDER — ONDANSETRON HCL 4 MG PO TABS: 4.0000 mg | ORAL_TABLET | Freq: Four times a day (QID) | ORAL | Status: DC | PRN

## 2017-12-23 MED ORDER — DEXTROSE 5 % IV SOLN
INTRAVENOUS | Status: DC | PRN
  Administered 2017-12-23: 50 ug/min via INTRAVENOUS

## 2017-12-23 MED ORDER — METHOCARBAMOL 1000 MG/10ML IJ SOLN
500.0000 mg | Freq: Four times a day (QID) | INTRAVENOUS | Status: DC | PRN
  Administered 2017-12-23: 500 mg via INTRAVENOUS
  Filled 2017-12-23: qty 550

## 2017-12-23 MED ORDER — SODIUM CHLORIDE 0.9 % IV SOLN
INTRAVENOUS | Status: DC
  Administered 2017-12-23: 100 mL/h via INTRAVENOUS

## 2017-12-23 MED ORDER — PHENYLEPHRINE HCL 10 MG/ML IJ SOLN
INTRAMUSCULAR | Status: AC
  Filled 2017-12-23: qty 1

## 2017-12-23 MED ORDER — PROPOFOL 10 MG/ML IV BOLUS
INTRAVENOUS | Status: AC
  Filled 2017-12-23: qty 40

## 2017-12-23 MED ORDER — CEFAZOLIN SODIUM-DEXTROSE 2-4 GM/100ML-% IV SOLN
2.0000 g | INTRAVENOUS | Status: AC
  Administered 2017-12-23: 2 g via INTRAVENOUS
  Filled 2017-12-23: qty 100

## 2017-12-23 MED ORDER — DOXAZOSIN MESYLATE 4 MG PO TABS
4.0000 mg | ORAL_TABLET | Freq: Every day | ORAL | Status: DC
  Administered 2017-12-24: 4 mg via ORAL
  Filled 2017-12-23 (×2): qty 1

## 2017-12-23 SURGICAL SUPPLY — 34 items
BAG DECANTER FOR FLEXI CONT (MISCELLANEOUS) IMPLANT
BAG ZIPLOCK 12X15 (MISCELLANEOUS) ×3 IMPLANT
BLADE SAG 18X100X1.27 (BLADE) ×3 IMPLANT
CAPT HIP TOTAL 2 ×3 IMPLANT
CLOSURE WOUND 1/2 X4 (GAUZE/BANDAGES/DRESSINGS) ×2
CLOTH BEACON ORANGE TIMEOUT ST (SAFETY) ×3 IMPLANT
COVER PERINEAL POST (MISCELLANEOUS) ×3 IMPLANT
COVER SURGICAL LIGHT HANDLE (MISCELLANEOUS) ×3 IMPLANT
DECANTER SPIKE VIAL GLASS SM (MISCELLANEOUS) ×3 IMPLANT
DRAPE STERI IOBAN 125X83 (DRAPES) ×3 IMPLANT
DRAPE U-SHAPE 47X51 STRL (DRAPES) ×6 IMPLANT
DRSG ADAPTIC 3X8 NADH LF (GAUZE/BANDAGES/DRESSINGS) ×3 IMPLANT
DRSG MEPILEX BORDER 4X4 (GAUZE/BANDAGES/DRESSINGS) ×3 IMPLANT
DRSG MEPILEX BORDER 4X8 (GAUZE/BANDAGES/DRESSINGS) ×3 IMPLANT
DURAPREP 26ML APPLICATOR (WOUND CARE) ×3 IMPLANT
ELECT REM PT RETURN 15FT ADLT (MISCELLANEOUS) ×3 IMPLANT
EVACUATOR 1/8 PVC DRAIN (DRAIN) ×3 IMPLANT
GLOVE BIO SURGEON STRL SZ7.5 (GLOVE) ×3 IMPLANT
GLOVE BIO SURGEON STRL SZ8 (GLOVE) ×6 IMPLANT
GLOVE BIOGEL PI IND STRL 8 (GLOVE) ×2 IMPLANT
GLOVE BIOGEL PI INDICATOR 8 (GLOVE) ×4
GOWN STRL REUS W/TWL LRG LVL3 (GOWN DISPOSABLE) ×3 IMPLANT
GOWN STRL REUS W/TWL XL LVL3 (GOWN DISPOSABLE) ×3 IMPLANT
PACK ANTERIOR HIP CUSTOM (KITS) ×3 IMPLANT
STRIP CLOSURE SKIN 1/2X4 (GAUZE/BANDAGES/DRESSINGS) ×4 IMPLANT
SUT ETHIBOND NAB CT1 #1 30IN (SUTURE) ×3 IMPLANT
SUT MNCRL AB 4-0 PS2 18 (SUTURE) ×3 IMPLANT
SUT STRATAFIX 0 PDS 27 VIOLET (SUTURE) ×3
SUT VIC AB 2-0 CT1 27 (SUTURE) ×4
SUT VIC AB 2-0 CT1 TAPERPNT 27 (SUTURE) ×2 IMPLANT
SUTURE STRATFX 0 PDS 27 VIOLET (SUTURE) ×1 IMPLANT
SYR 50ML LL SCALE MARK (SYRINGE) IMPLANT
TRAY FOLEY W/METER SILVER 16FR (SET/KITS/TRAYS/PACK) ×3 IMPLANT
YANKAUER SUCT BULB TIP 10FT TU (MISCELLANEOUS) ×3 IMPLANT

## 2017-12-23 NOTE — Anesthesia Postprocedure Evaluation (Signed)
Anesthesia Post Note  Patient: Engineer, structural  Procedure(s) Performed: RIGHT TOTAL HIP ARTHROPLASTY ANTERIOR APPROACH (Right Hip)     Patient location during evaluation: PACU Anesthesia Type: Spinal Level of consciousness: oriented and awake and alert Pain management: pain level controlled Vital Signs Assessment: post-procedure vital signs reviewed and stable Respiratory status: spontaneous breathing, respiratory function stable and patient connected to nasal cannula oxygen Cardiovascular status: blood pressure returned to baseline and stable Postop Assessment: no headache, no backache and no apparent nausea or vomiting Anesthetic complications: no    Last Vitals:  Vitals:   12/23/17 0859 12/23/17 1205  BP: (!) 173/86   Pulse: 73   Resp: 16 16  Temp: 36.7 C (!) 36.4 C  SpO2: 100%     Last Pain:  Vitals:   12/23/17 0859  TempSrc: Oral                 Gwenyth Dingee

## 2017-12-23 NOTE — Transfer of Care (Signed)
Immediate Anesthesia Transfer of Care Note  Patient: Luke Becker  Procedure(s) Performed: RIGHT TOTAL HIP ARTHROPLASTY ANTERIOR APPROACH (Right Hip)  Patient Location: PACU  Anesthesia Type:Spinal  Level of Consciousness: sedated  Airway & Oxygen Therapy: Patient Spontanous Breathing and Patient connected to face mask oxygen  Post-op Assessment: Report given to RN and Post -op Vital signs reviewed and stable  Post vital signs: Reviewed and stable  Last Vitals:  Vitals:   12/23/17 0859  BP: (!) 173/86  Pulse: 73  Resp: 16  Temp: 36.7 C  SpO2: 100%    Last Pain:  Vitals:   12/23/17 0859  TempSrc: Oral         Complications: No apparent anesthesia complications

## 2017-12-23 NOTE — Interval H&P Note (Signed)
History and Physical Interval Note:  12/23/2017 9:27 AM  Luke Becker  has presented today for surgery, with the diagnosis of Osteoarthritis Right Hip  The various methods of treatment have been discussed with the patient and family. After consideration of risks, benefits and other options for treatment, the patient has consented to  Procedure(s): RIGHT TOTAL HIP ARTHROPLASTY ANTERIOR APPROACH (Right) as a surgical intervention .  The patient's history has been reviewed, patient examined, no change in status, stable for surgery.  I have reviewed the patient's chart and labs.  Questions were answered to the patient's satisfaction.     Pilar Plate Daven Pinckney

## 2017-12-23 NOTE — Evaluation (Signed)
Physical Therapy Evaluation Patient Details Name: Luke Becker MRN: [id number] DOB: [birth year] Today's Date: 12/23/2017   History of Present Illness  RDATHA  Clinical Impression  The patient  Reports some mild numbness of the posterior thigh but able to stand and ambulates. Plans DC home. Pt admitted with above diagnosis. Pt currently with functional limitations due to the deficits listed below (see PT Problem List).  Pt will benefit from skilled PT to increase their independence and safety with mobility to allow discharge to the venue listed below.       Follow Up Recommendations DC plan and follow up therapy as arranged by surgeon    Equipment Recommendations  (has a 4 wheeled, TBA)    Recommendations for Other Services       Precautions / Restrictions Precautions Precautions: Fall      Mobility  Bed Mobility Overal bed mobility: Needs Assistance Bed Mobility: Supine to Sit     Supine to sit: Min guard     General bed mobility comments: cues for technique  Transfers Overall transfer level: Needs assistance Equipment used: Rolling walker (2 wheeled) Transfers: Sit to/from Stand Sit to Stand: Min assist         General transfer comment: cues for hand right leg position, did not appear to have any difficulty with mild numv=bness and stood without any problem  Ambulation/Gait Ambulation/Gait assistance: Min assist Ambulation Distance (Feet): 100 Feet Assistive device: Rolling walker (2 wheeled) Gait Pattern/deviations: Step-to pattern;Step-through pattern;Antalgic     General Gait Details: cues for sequence  Stairs            Wheelchair Mobility    Modified Rankin (Stroke Patients Only)       Balance                                             Pertinent Vitals/Pain Pain Assessment: 0-10 Pain Score: 2  Pain Location: right thigh Pain Descriptors / Indicators: Sore Pain Intervention(s): Premedicated before  session;Monitored during session    Home Living Family/patient expects to be discharged to:: Private residence Living Arrangements: Spouse/significant other Available Help at Discharge: Available 24 hours/day;Family Type of Home: House Home Access: Stairs to enter   [employer] of Steps: 4 Home Layout: One level Home Equipment: Environmental consultant - 4 wheels;Cane - single point      Prior Function Level of Independence: Independent               Hand Dominance        Extremity/Trunk Assessment        Lower Extremity Assessment Lower Extremity Assessment: RLE deficits/detail RLE Deficits / Details: reports the backs of thigh are numbish    Cervical / Trunk Assessment Cervical / Trunk Assessment: Normal  Communication   Communication: No difficulties  Cognition Arousal/Alertness: Awake/alert Behavior During Therapy: WFL for tasks assessed/performed Overall Cognitive Status: Within Functional Limits for tasks assessed                                        General Comments      Exercises Total Joint Exercises Ankle Circles/Pumps: AROM Heel Slides: AAROM;Right;10 reps   Assessment/Plan    PT Assessment Patient needs continued PT services  PT Problem List Decreased strength;Decreased range of motion;Pain;Decreased mobility;Decreased activity tolerance;Decreased  safety awareness;Decreased knowledge of use of DME       PT Treatment Interventions DME instruction;Functional mobility training;Patient/family education;Gait training;Therapeutic activities;Stair training;Therapeutic exercise    PT Goals (Current goals can be found in the Care Plan section)  Acute Rehab PT Goals Patient Stated Goal: to do those things I've missed PT Goal Formulation: With patient Time For Goal Achievement: 12/26/17 Potential to Achieve Goals: Good    Frequency 7X/week   Barriers to discharge        Co-evaluation               AM-PAC PT "6 Clicks"  Daily Activity  Outcome Measure Difficulty turning over in bed (including adjusting bedclothes, sheets and blankets)?: A Little Difficulty moving from lying on back to sitting on the side of the bed? : A Little Difficulty sitting down on and standing up from a chair with arms (e.g., wheelchair, bedside commode, etc,.)?: A Little Help needed moving to and from a bed to chair (including a wheelchair)?: A Little Help needed walking in hospital room?: A Little Help needed climbing 3-5 steps with a railing? : A Little 6 Click Score: 18    End of Session   Activity Tolerance: Patient tolerated treatment well Patient left: in chair;with call bell/phone within reach Nurse Communication: Mobility status PT Visit Diagnosis: Unsteadiness on feet (R26.81);Pain Pain - Right/Left: Right Pain - part of body: Hip    Time: 1660-6004 PT Time Calculation (min) (ACUTE ONLY): 32 min   Charges:   PT Evaluation $PT Eval Low Complexity: 1 Low PT Treatments $Gait Training: 8-22 mins   PT G CodesTresa Endo PT [phone]   Claretha Cooper 12/23/2017, 6:11 PM

## 2017-12-23 NOTE — Anesthesia Procedure Notes (Signed)
Procedure Name: MAC Date/Time: 12/23/2017 10:01 AM Performed by: Deliah Boston, CRNA Pre-anesthesia Checklist: Patient identified, Emergency Drugs available, Suction available, Patient being monitored and Timeout performed Patient Re-evaluated:Patient Re-evaluated prior to induction Oxygen Delivery Method: Simple face mask Preoxygenation: Pre-oxygenation with 100% oxygen Placement Confirmation: positive ETCO2,  breath sounds checked- equal and bilateral and CO2 detector

## 2017-12-23 NOTE — Interval H&P Note (Signed)
History and Physical Interval Note:  12/23/2017 9:36 AM  Luke Becker  has presented today for surgery, with the diagnosis of Osteoarthritis Right Hip  The various methods of treatment have been discussed with the patient and family. After consideration of risks, benefits and other options for treatment, the patient has consented to  Procedure(s): RIGHT TOTAL HIP ARTHROPLASTY ANTERIOR APPROACH (Right) as a surgical intervention .  The patient's history has been reviewed, patient examined, no change in status, stable for surgery.  I have reviewed the patient's chart and labs.  Questions were answered to the patient's satisfaction.     Pilar Plate Harve Spradley

## 2017-12-23 NOTE — Op Note (Signed)
OPERATIVE REPORT- TOTAL HIP ARTHROPLASTY   PREOPERATIVE DIAGNOSIS: Osteoarthritis of the Right hip.   POSTOPERATIVE DIAGNOSIS: Osteoarthritis of the Right  hip.   PROCEDURE: Right total hip arthroplasty, anterior approach.   SURGEON: [name], MD   ASSISTANT: Arlee Muslim, PA-C  ANESTHESIA:  Spinal  ESTIMATED BLOOD LOSS:-550 mL    DRAINS: Hemovac x1.   COMPLICATIONS: None   CONDITION: PACU - hemodynamically stable.   BRIEF CLINICAL NOTE: Luke Becker is a 72 y.o. male who has advanced end-  stage arthritis of their Right  hip with progressively worsening pain and  dysfunction.The patient has failed nonoperative management and presents for  total hip arthroplasty.   PROCEDURE IN DETAIL: After successful administration of spinal  anesthetic, the traction boots for the [facility] bed were placed on both  feet and the patient was placed onto the [facility] bed, boots placed into the leg  holders. The Right hip was then isolated from the perineum with plastic  drapes and prepped and draped in the usual sterile fashion. ASIS and  greater trochanter were marked and a oblique incision was made, starting  at about 1 cm lateral and 2 cm distal to the ASIS and coursing towards  the anterior cortex of the femur. The skin was cut with a 10 blade  through subcutaneous tissue to the level of the fascia overlying the  tensor fascia lata muscle. The fascia was then incised in line with the  incision at the junction of the anterior third and posterior 2/3rd. The  muscle was teased off the fascia and then the interval between the TFL  and the rectus was developed. The Hohmann retractor was then placed at  the top of the femoral neck over the capsule. The vessels overlying the  capsule were cauterized and the fat on top of the capsule was removed.  A Hohmann retractor was then placed anterior underneath the rectus  femoris to give exposure to the entire anterior capsule. A T-shaped   capsulotomy was performed. The edges were tagged and the femoral head  was identified.       Osteophytes are removed off the superior acetabulum.  The femoral neck was then cut in situ with an oscillating saw. Traction  was then applied to the left lower extremity utilizing the [facility]  traction. The femoral head was then removed. Retractors were placed  around the acetabulum and then circumferential removal of the labrum was  performed. Osteophytes were also removed. Reaming starts at 49 mm to  medialize and  Increased in 2 mm increments to 53 mm. We reamed in  approximately 40 degrees of abduction, 20 degrees anteversion. A 54  mm  pinnacle acetabular shell was then impacted in anatomic position under  fluoroscopic guidance with excellent purchase. We did not need to place  any additional dome screws. A 36 mm neutral + 4 marathon liner was then  placed into the acetabular shell.       The femoral lift was then placed along the lateral aspect of the femur  just distal to the vastus ridge. The leg was  externally rotated and capsule  was stripped off the inferior aspect of the femoral neck down to the  level of the lesser trochanter, this was done with electrocautery. The femur was lifted after this was performed. The  leg was then placed in an extended and adducted position essentially delivering the femur. We also removed the capsule superiorly and the piriformis from the piriformis  fossa to gain excellent exposure of the  proximal femur. Rongeur was used to remove some cancellous bone to get  into the lateral portion of the proximal femur for placement of the  initial starter reamer. The starter broaches was placed  the starter broach  and was shown to go down the center of the canal. Broaching  with the  Corail system was then performed starting at size 8, coursing  Up to size 12. A size 12 had excellent torsional and rotational  and axial stability. The trial high offset neck was then  placed  with a 36 + 1.5 trial head. The hip was then reduced. We confirmed that  the stem was in the canal both on AP and lateral x-rays. It also has excellent sizing. The hip was reduced with outstanding stability through full extension and full external rotation.. AP pelvis was taken and the leg lengths were measured and found to be equal. Hip was then dislocated again and the femoral head and neck removed. The  femoral broach was removed. Size 12 Corail stem with a high offset  neck was then impacted into the femur following native anteversion. Has  excellent purchase in the canal. Excellent torsional and rotational and  axial stability. It is confirmed to be in the canal on AP and lateral  fluoroscopic views. The 36 + 1.5 ceramic head was placed and the hip  reduced with outstanding stability. Again AP pelvis was taken and it  confirmed that the leg lengths were equal. The wound was then copiously  irrigated with saline solution and the capsule reattached and repaired  with Ethibond suture. 30 ml of .25% Bupivicaine was  injected into the capsule and into the edge of the tensor fascia lata as well as subcutaneous tissue. The fascia overlying the tensor fascia lata was then closed with a running #1 V-Loc. Subcu was closed with interrupted 2-0 Vicryl and subcuticular running 4-0 Monocryl. Incision was cleaned  and dried. Steri-Strips and a bulky sterile dressing applied. Hemovac  drain was hooked to suction and then the patient was awakened and transported to  recovery in stable condition.        Please note that a surgical assistant was a medical necessity for this procedure to perform it in a safe and expeditious manner. Assistant was necessary to provide appropriate retraction of vital neurovascular structures and to prevent femoral fracture and allow for anatomic placement of the prosthesis.  [name], M.D.

## 2017-12-23 NOTE — Anesthesia Procedure Notes (Signed)
Spinal  Patient location during procedure: OR Start time: 12/23/2017 10:20 AM End time: 12/23/2017 10:25 AM Staffing Anesthesiologist: Janeece Riggers, MD Preanesthetic Checklist Completed: patient identified, site marked, surgical consent, pre-op evaluation, timeout performed, IV checked, risks and benefits discussed and monitors and equipment checked Spinal Block Patient position: sitting Prep: DuraPrep Patient monitoring: heart rate, cardiac monitor, continuous pulse ox and blood pressure Approach: midline Location: L3-4 Injection technique: single-shot Needle Needle type: Sprotte  Needle gauge: 24 G Needle length: 9 cm Assessment Sensory level: T4

## 2017-12-24 LAB — BASIC METABOLIC PANEL
Anion gap: 6 (ref 5–15)
BUN: 10 mg/dL (ref 6–20)
CO2: 25 mmol/L (ref 22–32)
Calcium: 8.5 mg/dL — ABNORMAL LOW (ref 8.9–10.3)
Chloride: 106 mmol/L (ref 101–111)
Creatinine, Ser: 1.15 mg/dL (ref 0.61–1.24)
GFR calc Af Amer: >60 mL/min (ref >60)
GLUCOSE: 140 mg/dL — AB (ref 65–99)
Potassium: 4.1 mmol/L (ref 3.5–5.1)
Sodium: 137 mmol/L (ref 135–145)

## 2017-12-24 LAB — CBC
HEMATOCRIT: 31.9 % — AB (ref 39.0–52.0)
Hemoglobin: 11.1 g/dL — ABNORMAL LOW (ref 13.0–17.0)
MCH: 31.5 pg (ref 26.0–34.0)
MCHC: 34.8 g/dL (ref 30.0–36.0)
MCV: 90.6 fL (ref 78.0–100.0)
PLATELETS: 256 10*3/uL (ref 150–400)
RBC: 3.52 MIL/uL — AB (ref 4.22–5.81)
RDW: 12.1 % (ref 11.5–15.5)
WBC: 14.3 10*3/uL — ABNORMAL HIGH (ref 4.0–10.5)

## 2017-12-24 MED ORDER — TRAMADOL HCL 50 MG PO TABS: 50.0000 mg | ORAL_TABLET | Freq: Four times a day (QID) | ORAL | 0 refills | Status: DC | PRN

## 2017-12-24 MED ORDER — SODIUM CHLORIDE 0.9 % IV BOLUS (SEPSIS)
250.0000 mL | Freq: Once | INTRAVENOUS | Status: AC
  Administered 2017-12-24: 09:00:00 250 mL via INTRAVENOUS

## 2017-12-24 MED ORDER — METHOCARBAMOL 500 MG PO TABS: 500.0000 mg | ORAL_TABLET | Freq: Four times a day (QID) | ORAL | 0 refills | Status: DC | PRN

## 2017-12-24 MED ORDER — RIVAROXABAN 10 MG PO TABS: 10.0000 mg | ORAL_TABLET | Freq: Every day | ORAL | 0 refills | Status: DC

## 2017-12-24 MED ORDER — OXYCODONE HCL 5 MG PO TABS: 5.0000 mg | ORAL_TABLET | ORAL | 0 refills | Status: DC | PRN

## 2017-12-24 NOTE — Progress Notes (Signed)
Physical Therapy Treatment Patient Details Name: Luke Becker MRN: [id number] DOB: August 19, 1946 Today's Date: 12/24/2017    History of Present Illness RDATHA    PT Comments    POD # 1 pm session Assisted with amb a greater distance in hallway then returned to room to perform all standing TE's following HEP handout.  Instructed on proper tech and freq as well as use of ICE. Instructed on proper tech to perform stairs.  Pt has 4 steps and one rail.   Pt has met mobility goals to D/C to home.   Follow Up Recommendations  DC plan and follow up therapy as arranged by surgeon     Equipment Recommendations       Recommendations for Other Services       Precautions / Restrictions Precautions Precautions: Fall Restrictions Weight Bearing Restrictions: No    Mobility  Bed Mobility               General bed mobility comments: OOB in recliner   Transfers Overall transfer level: Needs assistance Equipment used: Rolling walker (2 wheeled) Transfers: Sit to/from Stand Sit to Stand: Supervision;Min guard         General transfer comment: <25% VC's on safety with turns and proper hand placement with stand to sit  Ambulation/Gait Ambulation/Gait assistance: Supervision;Min guard Ambulation Distance (Feet): 110 Feet Assistive device: Rolling walker (2 wheeled) Gait Pattern/deviations: Step-to pattern;Step-through pattern;Antalgic     General Gait Details: cues for sequence   Stairs            Wheelchair Mobility    Modified Rankin (Stroke Patients Only)       Balance                                            Cognition Arousal/Alertness: Awake/alert Behavior During Therapy: WFL for tasks assessed/performed Overall Cognitive Status: Within Functional Limits for tasks assessed                                        Exercises  10 reps all standing TE's following HEP handout    General Comments         Pertinent Vitals/Pain Pain Assessment: 0-10 Pain Score: 2  Pain Location: right thigh Pain Descriptors / Indicators: Sore Pain Intervention(s): Monitored during session;Repositioned;Ice applied    Home Living                      Prior Function            PT Goals (current goals can now be found in the care plan section) Progress towards PT goals: Progressing toward goals    Frequency    7X/week      PT Plan Current plan remains appropriate    Co-evaluation              AM-PAC PT "6 Clicks" Daily Activity  Outcome Measure  Difficulty turning over in bed (including adjusting bedclothes, sheets and blankets)?: A Little Difficulty moving from lying on back to sitting on the side of the bed? : A Little Difficulty sitting down on and standing up from a chair with arms (e.g., wheelchair, bedside commode, etc,.)?: A Little Help needed moving to and from a bed to chair (including a wheelchair)?: A Little  Help needed walking in hospital room?: A Little Help needed climbing 3-5 steps with a railing? : A Little 6 Click Score: 18    End of Session Equipment Utilized During Treatment: Gait belt Activity Tolerance: Patient tolerated treatment well Patient left: in chair;with call bell/phone within reach Nurse Communication: Mobility status PT Visit Diagnosis: Unsteadiness on feet (R26.81);Pain Pain - Right/Left: Right Pain - part of body: Hip     Time: 1325-1350 PT Time Calculation (min) (ACUTE ONLY): 25 min  Charges:  $Gait Training: 8-22 mins  $Therapeutic Activity: 8-22 mins                    G Codes:       Rica Koyanagi  PTA [facility]  Acute  Rehab Pager      [phone]

## 2017-12-24 NOTE — Progress Notes (Signed)
Physical Therapy Treatment Patient Details Name: Luke Becker MRN: [id number] DOB: Apr 30, 1946 Today's Date: 12/24/2017    History of Present Illness RDATHA    PT Comments    POD # 1 am session Assisted with amb a greater distance then returned to room to perform some THR TE's following HEP handout.  Instructed on proper tech, ferq as well as use of ICE.   Follow Up Recommendations  DC plan and follow up therapy as arranged by surgeon     Equipment Recommendations       Recommendations for Other Services       Precautions / Restrictions Precautions Precautions: Fall Restrictions Weight Bearing Restrictions: No    Mobility  Bed Mobility               General bed mobility comments: OOB in recliner   Transfers Overall transfer level: Needs assistance Equipment used: Rolling walker (2 wheeled) Transfers: Sit to/from Stand Sit to Stand: Supervision;Min guard         General transfer comment: <25% VC's on safety with turns and proper hand placement with stand to sit  Ambulation/Gait Ambulation/Gait assistance: Supervision;Min guard Ambulation Distance (Feet): 110 Feet Assistive device: Rolling walker (2 wheeled) Gait Pattern/deviations: Step-to pattern;Step-through pattern;Antalgic     General Gait Details: cues for sequence   Stairs            Wheelchair Mobility    Modified Rankin (Stroke Patients Only)       Balance                                            Cognition Arousal/Alertness: Awake/alert Behavior During Therapy: WFL for tasks assessed/performed Overall Cognitive Status: Within Functional Limits for tasks assessed                                        Exercises   Total Hip Replacement TE's 10 reps ankle pumps 10 reps knee presses 10 reps heel slides 10 reps SAQ's 10 reps ABD Followed by ICE     General Comments        Pertinent Vitals/Pain Pain Assessment: 0-10 Pain Score:  2  Pain Location: right thigh Pain Descriptors / Indicators: Sore Pain Intervention(s): Monitored during session;Repositioned;Ice applied    Home Living                      Prior Function            PT Goals (current goals can now be found in the care plan section) Progress towards PT goals: Progressing toward goals    Frequency    7X/week      PT Plan Current plan remains appropriate    Co-evaluation              AM-PAC PT "6 Clicks" Daily Activity  Outcome Measure  Difficulty turning over in bed (including adjusting bedclothes, sheets and blankets)?: A Little Difficulty moving from lying on back to sitting on the side of the bed? : A Little Difficulty sitting down on and standing up from a chair with arms (e.g., wheelchair, bedside commode, etc,.)?: A Little Help needed moving to and from a bed to chair (including a wheelchair)?: A Little Help needed walking in hospital room?: A Little Help needed  climbing 3-5 steps with a railing? : A Little 6 Click Score: 18    End of Session Equipment Utilized During Treatment: Gait belt Activity Tolerance: Patient tolerated treatment well Patient left: in chair;with call bell/phone within reach Nurse Communication: Mobility status PT Visit Diagnosis: Unsteadiness on feet (R26.81);Pain Pain - Right/Left: Right Pain - part of body: Hip     Time: 0940-1010 PT Time Calculation (min) (ACUTE ONLY): 30 min  Charges:  $Gait Training: 8-22 mins $Therapeutic Exercise: 8-22 mins                    G Codes:       Rica Koyanagi  PTA [facility]  Acute  Rehab Pager      [phone]

## 2017-12-24 NOTE — Discharge Instructions (Signed)
° °Dr. Frank Aluisio °Total Joint Specialist °[facility] °[address] °, [place] [address] °[phone] ° °ANTERIOR APPROACH TOTAL HIP REPLACEMENT POSTOPERATIVE DIRECTIONS ° ° °Hip Rehabilitation, Guidelines Following Surgery  °The results of a hip operation are greatly improved after range of motion and muscle strengthening exercises. Follow all safety measures which are given to protect your hip. If any of these exercises cause increased pain or swelling in your joint, decrease the amount until you are comfortable again. Then slowly increase the exercises. Call your caregiver if you have problems or questions.  ° °HOME CARE INSTRUCTIONS  °Remove items at home which could result in a fall. This includes throw rugs or furniture in walking pathways.  °· ICE to the affected hip every three hours for 30 minutes at a time and then as needed for pain and swelling.  Continue to use ice on the hip for pain and swelling from surgery. You may notice swelling that will progress down to the foot and ankle.  This is normal after surgery.  Elevate the leg when you are not up walking on it.   °· Continue to use the breathing machine which will help keep your temperature down.  It is common for your temperature to cycle up and down following surgery, especially at night when you are not up moving around and exerting yourself.  The breathing machine keeps your lungs expanded and your temperature down. ° ° °DIET °You may resume your previous home diet once your are discharged from the hospital. ° °DRESSING / WOUND CARE / SHOWERING °You may shower 3 days after surgery, but keep the wounds dry during showering.  You may use an occlusive plastic wrap (Press'n Seal for example), NO SOAKING/SUBMERGING IN THE BATHTUB.  If the bandage gets wet, change with a clean dry gauze.  If the incision gets wet, pat the wound dry with a clean towel. °You may start showering once you are discharged home but do not  submerge the incision under water. Just pat the incision dry and apply a dry gauze dressing on daily. °Change the surgical dressing daily and reapply a dry dressing each time. ° °ACTIVITY °Walk with your walker as instructed. °Use walker as long as suggested by your caregivers. °Avoid periods of inactivity such as sitting longer than an hour when not asleep. This helps prevent blood clots.  °You may resume a sexual relationship in one month or when given the OK by your doctor.  °You may return to work once you are cleared by your doctor.  °Do not drive a car for 6 weeks or until released by you surgeon.  °Do not drive while taking narcotics. ° °WEIGHT BEARING °Weight bearing as tolerated with assist device (walker, cane, etc) as directed, use it as long as suggested by your surgeon or therapist, typically at least 4-6 weeks. ° °POSTOPERATIVE CONSTIPATION PROTOCOL °Constipation - defined medically as fewer than three stools per week and severe constipation as less than one stool per week. ° °One of the most common issues patients have following surgery is constipation.  Even if you have a regular bowel pattern at home, your normal regimen is likely to be disrupted due to multiple reasons following surgery.  Combination of anesthesia, postoperative narcotics, change in appetite and fluid intake all can affect your bowels.  In order to avoid complications following surgery, here are some recommendations in order to help you during your recovery period. ° °Colace (docusate) - Pick up an over-the-counter   form of Colace or another stool softener and take twice a day as long as you are requiring postoperative pain medications.  Take with a full glass of water daily.  If you experience loose stools or diarrhea, hold the colace until you stool forms back up.  If your symptoms do not get better within 1 week or if they get worse, check with your doctor. ° °Dulcolax (bisacodyl) - Pick up over-the-counter and take as directed  by the product packaging as needed to assist with the movement of your bowels.  Take with a full glass of water.  Use this product as needed if not relieved by Colace only.  ° °MiraLax (polyethylene glycol) - Pick up over-the-counter to have on hand.  MiraLax is a solution that will increase the amount of water in your bowels to assist with bowel movements.  Take as directed and can mix with a glass of water, juice, soda, coffee, or tea.  Take if you go more than two days without a movement. °Do not use MiraLax more than once per day. Call your doctor if you are still constipated or irregular after using this medication for 7 days in a row. ° °If you continue to have problems with postoperative constipation, please contact the office for further assistance and recommendations.  If you experience "the worst abdominal pain ever" or develop nausea or vomiting, please contact the office immediatly for further recommendations for treatment. ° °ITCHING ° If you experience itching with your medications, try taking only a single pain pill, or even half a pain pill at a time.  You can also use Benadryl over the counter for itching or also to help with sleep.  ° °TED HOSE STOCKINGS °Wear the elastic stockings on both legs for three weeks following surgery during the day but you may remove then at night for sleeping. ° °MEDICATIONS °See your medication summary on the “After Visit Summary” that the nursing staff will review with you prior to discharge.  You may have some home medications which will be placed on hold until you complete the course of blood thinner medication.  It is important for you to complete the blood thinner medication as prescribed by your surgeon.  Continue your approved medications as instructed at time of discharge. ° °PRECAUTIONS °If you experience chest pain or shortness of breath - call 911 immediately for transfer to the hospital emergency department.  °If you develop a fever greater that 101 F,  purulent drainage from wound, increased redness or drainage from wound, foul odor from the wound/dressing, or calf pain - CONTACT YOUR SURGEON.   °                                                °FOLLOW-UP APPOINTMENTS °Make sure you keep all of your appointments after your operation with your surgeon and caregivers. You should call the office at the above phone number and make an appointment for approximately two weeks after the date of your surgery or on the date instructed by your surgeon outlined in the "After Visit Summary". ° °RANGE OF MOTION AND STRENGTHENING EXERCISES  °These exercises are designed to help you keep full movement of your hip joint. Follow your caregiver's or physical therapist's instructions. Perform all exercises about fifteen times, three times per day or as directed. Exercise both hips, even if you   have had only one joint replacement. These exercises can be done on a training (exercise) mat, on the floor, on a table or on a bed. Use whatever works the best and is most comfortable for you. Use music or television while you are exercising so that the exercises are a pleasant break in your day. This will make your life better with the exercises acting as a break in routine you can look forward to.  °Lying on your back, slowly slide your foot toward your buttocks, raising your knee up off the floor. Then slowly slide your foot back down until your leg is straight again.  °Lying on your back spread your legs as far apart as you can without causing discomfort.  °Lying on your side, raise your upper leg and foot straight up from the floor as far as is comfortable. Slowly lower the leg and repeat.  °Lying on your back, tighten up the muscle in the front of your thigh (quadriceps muscles). You can do this by keeping your leg straight and trying to raise your heel off the floor. This helps strengthen the largest muscle supporting your knee.  °Lying on your back, tighten up the muscles of your  buttocks both with the legs straight and with the knee bent at a comfortable angle while keeping your heel on the floor.  ° °IF YOU ARE TRANSFERRED TO A SKILLED REHAB FACILITY °If the patient is transferred to a skilled rehab facility following release from the hospital, a list of the current medications will be sent to the facility for the patient to continue.  When discharged from the skilled rehab facility, please have the facility set up the patient's Home Health Physical Therapy prior to being released. Also, the skilled facility will be responsible for providing the patient with their medications at time of release from the facility to include their pain medication, the muscle relaxants, and their blood thinner medication. If the patient is still at the rehab facility at time of the two week follow up appointment, the skilled rehab facility will also need to assist the patient in arranging follow up appointment in our office and any transportation needs. ° °MAKE SURE YOU:  °Understand these instructions.  °Get help right away if you are not doing well or get worse.  ° ° °Pick up stool softner and laxative for home use following surgery while on pain medications. °Do not submerge incision under water. °Please use good hand washing techniques while changing dressing each day. °May shower starting three days after surgery. °Please use a clean towel to pat the incision dry following showers. °Continue to use ice for pain and swelling after surgery. °Do not use any lotions or creams on the incision until instructed by your surgeon. ° °Take Xarelto for two and a half more weeks following discharge from the hospital, then discontinue Xarelto. °Once the patient has completed the blood thinner regimen, then take a Baby 81 mg Aspirin daily for three more weeks. ° ° ° ° °Information on my medicine - XARELTO® (Rivaroxaban) ° °Why was Xarelto® prescribed for you? °Xarelto® was prescribed for you to reduce the risk of blood  clots forming after orthopedic surgery. The medical term for these abnormal blood clots is venous thromboembolism (VTE). ° °What do you need to know about xarelto® ? °Take your Xarelto® ONCE DAILY at the same time every day. °You may take it either with or without food. ° °If you have difficulty swallowing the tablet whole, you may   crush it and mix in applesauce just prior to taking your dose. ° °Take Xarelto® exactly as prescribed by your doctor and DO NOT stop taking Xarelto® without talking to the doctor who prescribed the medication.  Stopping without other VTE prevention medication to take the place of Xarelto® may increase your risk of developing a clot. ° °After discharge, you should have regular check-up appointments with your healthcare provider that is prescribing your Xarelto®.   ° °What do you do if you miss a dose? °If you miss a dose, take it as soon as you remember on the same day then continue your regularly scheduled once daily regimen the next day. Do not take two doses of Xarelto® on the same day.  ° °Important Safety Information °A possible side effect of Xarelto® is bleeding. You should call your healthcare provider right away if you experience any of the following: °? Bleeding from an injury or your nose that does not stop. °? Unusual colored urine (red or dark brown) or unusual colored stools (red or black). °? Unusual bruising for unknown reasons. °? A serious fall or if you hit your head (even if there is no bleeding). ° °Some medicines may interact with Xarelto® and might increase your risk of bleeding while on Xarelto®. To help avoid this, consult your healthcare provider or pharmacist prior to using any new prescription or non-prescription medications, including herbals, vitamins, non-steroidal anti-inflammatory drugs (NSAIDs) and supplements. ° °This website has more information on Xarelto®: www.xarelto.com. ° ° ° °

## 2017-12-24 NOTE — Progress Notes (Signed)
Discharge planning, spoke with patient at bedside. Have chosen [facility] for HH PT, evaluate and treat. Contacted [facility] for referral. Needs RW and 3n1, contacted [facility] to deliver to room. [phone] 

## 2017-12-24 NOTE — Discharge Summary (Signed)
Physician Discharge Summary   Patient ID: Luke Becker MRN: [id number] DOB/AGE: 1945-12-22 72 y.o.  Admit date: 12/23/2017 Discharge date: 12-25-2017  Primary Diagnosis:  Osteoarthritis of the Right  hip.    Admission Diagnoses:  Past Medical History:  Diagnosis Date  . Arthritis   . Chronic hip pain   . Prostate enlargement    Discharge Diagnoses:   Principal Problem:   OA (osteoarthritis) of hip  Estimated body mass index is 27.17 kg/m as calculated from the following:   Height as of this encounter: _0  (1.753 m).   Weight as of this encounter: 83.5 kg (184 lb).  Procedure(s) (LRB): RIGHT TOTAL HIP ARTHROPLASTY ANTERIOR APPROACH (Right)   Consults: None  HPI: Luke Becker is a 72 y.o. male who has advanced end-  stage arthritis of their Right  hip with progressively worsening pain and  dysfunction.The patient has failed nonoperative management and presents for  total hip arthroplasty.    Laboratory Data: Admission on 12/23/2017  Component Date Value Ref Range Status  . WBC 12/24/2017 14.3* 4.0 - 10.5 K/uL Final  . RBC 12/24/2017 3.52* 4.22 - 5.81 MIL/uL Final  . Hemoglobin 12/24/2017 11.1* 13.0 - 17.0 g/dL Final  . HCT 12/24/2017 31.9* 39.0 - 52.0 % Final  . MCV 12/24/2017 90.6  78.0 - 100.0 fL Final  . MCH 12/24/2017 31.5  26.0 - 34.0 pg Final  . MCHC 12/24/2017 34.8  30.0 - 36.0 g/dL Final  . RDW 12/24/2017 12.1  11.5 - 15.5 % Final  . Platelets 12/24/2017 256  150 - 400 K/uL Final  . Sodium 12/24/2017 137  135 - 145 mmol/L Final  . Potassium 12/24/2017 4.1  3.5 - 5.1 mmol/L Final  . Chloride 12/24/2017 106  101 - 111 mmol/L Final  . CO2 12/24/2017 25  22 - 32 mmol/L Final  . Glucose, Bld 12/24/2017 140* 65 - 99 mg/dL Final  . BUN 12/24/2017 10  6 - 20 mg/dL Final  . Creatinine, Ser 12/24/2017 1.15  0.61 - 1.24 mg/dL Final  . Calcium 12/24/2017 8.5* 8.9 - 10.3 mg/dL Final  . GFR calc non Af Amer 12/24/2017 >60  >60 mL/min Final  . GFR calc Af Amer  12/24/2017 >60  >60 mL/min Final   Comment: (NOTE) The eGFR has been calculated using the CKD EPI equation. This calculation has not been validated in all clinical situations. eGFR's persistently <60 mL/min signify possible Chronic Kidney Disease.   . Anion gap 12/24/2017 6  5 - 15 Final  . WBC 12/25/2017 15.4* 4.0 - 10.5 K/uL Final  . RBC 12/25/2017 3.44* 4.22 - 5.81 MIL/uL Final  . Hemoglobin 12/25/2017 10.9* 13.0 - 17.0 g/dL Final  . HCT 12/25/2017 31.2* 39.0 - 52.0 % Final  . MCV 12/25/2017 90.7  78.0 - 100.0 fL Final  . MCH 12/25/2017 31.7  26.0 - 34.0 pg Final  . MCHC 12/25/2017 34.9  30.0 - 36.0 g/dL Final  . RDW 12/25/2017 12.5  11.5 - 15.5 % Final  . Platelets 12/25/2017 260  150 - 400 K/uL Final  . Sodium 12/25/2017 138  135 - 145 mmol/L Final  . Potassium 12/25/2017 3.5  3.5 - 5.1 mmol/L Final  . Chloride 12/25/2017 105  101 - 111 mmol/L Final  . CO2 12/25/2017 24  22 - 32 mmol/L Final  . Glucose, Bld 12/25/2017 119* 65 - 99 mg/dL Final  . BUN 12/25/2017 15  6 - 20 mg/dL Final  . Creatinine, Ser 12/25/2017 0.95  0.61 -  1.24 mg/dL Final  . Calcium 12/25/2017 8.7* 8.9 - 10.3 mg/dL Final  . GFR calc non Af Amer 12/25/2017 >60  >60 mL/min Final  . GFR calc Af Amer 12/25/2017 >60  >60 mL/min Final   Comment: (NOTE) The eGFR has been calculated using the CKD EPI equation. This calculation has not been validated in all clinical situations. eGFR's persistently <60 mL/min signify possible Chronic Kidney Disease.   Georgiann Hahn gap 12/25/2017 9  5 - 15 Final  Hospital Outpatient Visit on 12/18/2017  Component Date Value Ref Range Status  . aPTT 12/18/2017 36  24 - 36 seconds Final  . Prothrombin Time 12/18/2017 13.6  11.4 - 15.2 seconds Final  . INR 12/18/2017 1.05   Final  . ABO/RH(D) 12/18/2017 O POS   Final  . Antibody Screen 12/18/2017 NEG   Final  . Sample Expiration 12/18/2017 12/26/2017   Final  . Extend sample reason 12/18/2017 NO TRANSFUSIONS OR PREGNANCY IN THE PAST 3  MONTHS   Final  . MRSA, PCR 12/18/2017 NEGATIVE  NEGATIVE Final  . Staphylococcus aureus 12/18/2017 POSITIVE* NEGATIVE Final   Comment: (NOTE) The Xpert SA Assay (FDA approved for NASAL specimens in patients 70 years of age and older), is one component of a comprehensive surveillance program. It is not intended to diagnose infection nor to guide or monitor treatment.   . ABO/RH(D) 12/18/2017 O POS   Final     X-Rays:Dg Pelvis Portable  Result Date: 12/23/2017 CLINICAL DATA:  Post right total hip replacement EXAM: PORTABLE PELVIS 1-2 VIEWS COMPARISON:  Right hip radiographs-earlier same date FINDINGS: Post right total hip replacement. Alignment appears anatomic given solitary AP projection. No definite evidence of hardware failure or loosening. No definite fracture given AP projection. Limited visualization of the pelvis is normal. Suspected mild degenerative change within the contralateral left hip, incompletely evaluated. Degenerative changes is suspected within the lower lumbar spine, incompletely evaluated Several phleboliths overlie the lower pelvis bilaterally, right greater than left. A surgical drain is noted about the operative site. Scattered expected postoperative subcutaneous emphysema. No radiopaque foreign body. IMPRESSION: Post right total hip replacement without evidence of complication. Electronically Signed   By: Sandi Mariscal M.D.   On: 12/23/2017 12:40   Dg C-arm 1-60 Min-no Report  Result Date: 12/23/2017 Fluoroscopy was utilized by the requesting physician.  No radiographic interpretation.    EKG: Orders placed or performed during the hospital encounter of 10/29/16  . EKG 12-Lead  . EKG 12-Lead  . ED EKG within 10 minutes  . ED EKG within 10 minutes     Hospital Course: Patient was admitted to [facility] and taken to the OR and underwent the above state procedure without complications.  Patient tolerated the procedure well and was later transferred to the  recovery room and then to the orthopaedic floor for postoperative care.  They were given PO and IV analgesics for pain control following their surgery.  They were given 24 hours of postoperative antibiotics of  Anti-infectives (From admission, onward)   Start     Dose/Rate Route Frequency Ordered Stop   12/23/17 1630  ceFAZolin (ANCEF) IVPB 2g/100 mL premix     2 g 200 mL/hr over 30 Minutes Intravenous Every 6 hours 12/23/17 1307 12/23/17 2228   12/23/17 0833  ceFAZolin (ANCEF) IVPB 2g/100 mL premix     2 g 200 mL/hr over 30 Minutes Intravenous On call to O.R. 12/23/17 4097 12/23/17 1030     and started on DVT  prophylaxis in the form of Xarelto.   PT and OT were ordered for total hip protocol.  The patient was allowed to be WBAT with therapy. Discharge planning was consulted to help with postop disposition and equipment needs.  Patient had a decent night on the evening of surgery.  They started to get up OOB with therapy on day one.  Hemovac drain was pulled without difficulty. Unfortunately, he had difficulty voiding and stayed.  Continued to work with therapy into day two.  Dressing was changed on day two and the incision was healing well.  Voiding improved.   Patient was seen in rounds on day two and was ready to go home.  Diet - Regular diet Follow up - in 2 weeks Activity - WBAT Disposition - Home Condition Upon Discharge - Good D/C Meds - See DC Summary DVT Prophylaxis - Xarelto    Discharge Instructions    Call MD / Call 911   Complete by:  As directed    If you experience chest pain or shortness of breath, CALL 911 and be transported to the hospital emergency room.  If you develope a fever above 101 F, pus (white drainage) or increased drainage or redness at the wound, or calf pain, call your surgeon's office.   Change dressing   Complete by:  As directed    You may change your dressing dressing daily with sterile 4 x 4 inch gauze dressing and paper tape.  Do not submerge the  incision under water.   Constipation Prevention   Complete by:  As directed    Drink plenty of fluids.  Prune juice may be helpful.  You may use a stool softener, such as Colace (over the counter) 100 mg twice a day.  Use MiraLax (over the counter) for constipation as needed.   Diet general   Complete by:  As directed    Discharge instructions   Complete by:  As directed    Take Xarelto for two and a half more weeks, then discontinue Xarelto. Once the patient has completed the blood thinner regimen, then take a Baby 81 mg Aspirin daily for three more weeks.   Pick up stool softner and laxative for home use following surgery while on pain medications. Do not submerge incision under water. Please use good hand washing techniques while changing dressing each day. May shower starting three days after surgery. Please use a clean towel to pat the incision dry following showers. Continue to use ice for pain and swelling after surgery. Do not use any lotions or creams on the incision until instructed by your surgeon.  Wear both TED hose on both legs during the day every day for three weeks, but may remove the TED hose at night at home.  Postoperative Constipation Protocol  Constipation - defined medically as fewer than three stools per week and severe constipation as less than one stool per week.  One of the most common issues patients have following surgery is constipation.  Even if you have a regular bowel pattern at home, your normal regimen is likely to be disrupted due to multiple reasons following surgery.  Combination of anesthesia, postoperative narcotics, change in appetite and fluid intake all can affect your bowels.  In order to avoid complications following surgery, here are some recommendations in order to help you during your recovery period.  Colace (docusate) - Pick up an over-the-counter form of Colace or another stool softener and take twice a day as long as you  are requiring  postoperative pain medications.  Take with a full glass of water daily.  If you experience loose stools or diarrhea, hold the colace until you stool forms back up.  If your symptoms do not get better within 1 week or if they get worse, check with your doctor.  Dulcolax (bisacodyl) - Pick up over-the-counter and take as directed by the product packaging as needed to assist with the movement of your bowels.  Take with a full glass of water.  Use this product as needed if not relieved by Colace only.   MiraLax (polyethylene glycol) - Pick up over-the-counter to have on hand.  MiraLax is a solution that will increase the amount of water in your bowels to assist with bowel movements.  Take as directed and can mix with a glass of water, juice, soda, coffee, or tea.  Take if you go more than two days without a movement. Do not use MiraLax more than once per day. Call your doctor if you are still constipated or irregular after using this medication for 7 days in a row.  If you continue to have problems with postoperative constipation, please contact the office for further assistance and recommendations.  If you experience "the worst abdominal pain ever" or develop nausea or vomiting, please contact the office immediatly for further recommendations for treatment.   Do not sit on low chairs, stoools or toilet seats, as it may be difficult to get up from low surfaces   Complete by:  As directed    Driving restrictions   Complete by:  As directed    No driving until released by the physician.   Increase activity slowly as tolerated   Complete by:  As directed    Lifting restrictions   Complete by:  As directed    No lifting until released by the physician.   Patient may shower   Complete by:  As directed    You may shower without a dressing once there is no drainage.  Do not wash over the wound.  If drainage remains, do not shower until drainage stops.   TED hose   Complete by:  As directed    Use  stockings (TED hose) for 3 weeks on both leg(s).  You may remove them at night for sleeping.   Weight bearing as tolerated   Complete by:  As directed    Laterality:  right   Extremity:  Lower     Allergies as of 12/25/2017      Reactions   Azithromycin Other (See Comments)   Upset stomach      Medication List    STOP taking these medications   ibuprofen 200 MG tablet Commonly known as:  ADVIL,MOTRIN   OVER THE COUNTER MEDICATION     TAKE these medications   acetaminophen 500 MG tablet Commonly known as:  TYLENOL Take 1,000 mg by mouth every 6 (six) hours as needed for moderate pain or headache.   doxazosin 4 MG tablet Commonly known as:  CARDURA Take 4 mg by mouth daily.   finasteride 5 MG tablet Commonly known as:  PROSCAR Take 5 mg by mouth daily.   methocarbamol 500 MG tablet Commonly known as:  ROBAXIN Take 1 tablet (500 mg total) by mouth every 6 (six) hours as needed for muscle spasms.   oxyCODONE 5 MG immediate release tablet Commonly known as:  Oxy IR/ROXICODONE Take 1-2 tablets (5-10 mg total) by mouth every 4 (four) hours as needed for  moderate pain or severe pain.   pantoprazole 40 MG tablet Commonly known as:  PROTONIX Take 40 mg by mouth daily as needed (for acid reflux or heartburn).   rivaroxaban 10 MG Tabs tablet Commonly known as:  XARELTO Take 1 tablet (10 mg total) by mouth daily with breakfast. Take Xarelto for two and a half more weeks following discharge from the hospital, then discontinue Xarelto. Once the patient has completed the blood thinner regimen, then take a Baby 81 mg Aspirin daily for three more weeks.   traMADol 50 MG tablet Commonly known as:  ULTRAM Take 1-2 tablets (50-100 mg total) by mouth every 6 (six) hours as needed (mild pain).            Durable Medical Equipment  (From admission, onward)        Start     Ordered   12/24/17 1043  For home use only DME 3 n 1  Once     12/24/17 1042   12/24/17 1042  For  home use only DME Walker rolling  Once    Question:  Patient needs a walker to treat with the following condition  Answer:  S/P hip replacement   12/24/17 1042       Discharge Care Instructions  (From admission, onward)        Start     Ordered   12/24/17 0000  Weight bearing as tolerated    Question Answer Comment  Laterality right   Extremity Lower      12/24/17 1150   12/24/17 0000  Change dressing    Comments:  You may change your dressing dressing daily with sterile 4 x 4 inch gauze dressing and paper tape.  Do not submerge the incision under water.   12/24/17 1150     Follow-up Information    Home, [facility] Follow up.   Specialty:  Home Health Services Why:  physical therapy Contact information: [address] [place] [address] [facility] Follow up.   Why:  walker and [address] Contact information: [address] [place] [address] [phone]        [name], MD. Schedule an appointment as soon as possible for a visit on 01/07/2018.   Specialty:  Orthopedic Surgery Contact information: [address] [place] [address] [phone]           Signed: Arlee Muslim, PA-C Orthopaedic Surgery 12/25/2017, 7:20 AM

## 2017-12-24 NOTE — Progress Notes (Signed)
   Subjective: 1 Day Post-Op Procedure(s) (LRB): RIGHT TOTAL HIP ARTHROPLASTY ANTERIOR APPROACH (Right) Patient reports pain as mild.   Patient seen in rounds for Dr. Wynelle Link. Patient is well, but has had some minor complaints of pain in the hip, requiring pain medications We will start therapy today.  If they do well with therapy and meets all goals, then will allow home later this afternoon following therapy. Plan is to go Home after hospital stay.  Objective: Vital signs in last 24 hours: Temp:  [97.5 F (36.4 C)-98.7 F (37.1 C)] 98.6 F (37 C) (01/22 0908) Pulse Rate:  [56-103] 66 (01/22 0908) Resp:  [12-18] 17 (01/22 0908) BP: (117-142)/(61-84) 119/61 (01/22 0908) SpO2:  [95 %-100 %] 97 % (01/22 0908) Weight:  [83.5 kg (184 lb)] 83.5 kg (184 lb) (01/21 1300)  Intake/Output from previous day:  Intake/Output Summary (Last 24 hours) at 12/24/2017 1146 Last data filed at 12/24/2017 0902 Gross per 24 hour  Intake 2558.33 ml  Output 2890 ml  Net -331.67 ml    Intake/Output this shift: Total I/O In: 600 [P.O.:600] Out: -   Labs: Recent Labs    12/24/17 0543  HGB 11.1*   Recent Labs    12/24/17 0543  WBC 14.3*  RBC 3.52*  HCT 31.9*  PLT 256   Recent Labs    12/24/17 0543  NA 137  K 4.1  CL 106  CO2 25  BUN 10  CREATININE 1.15  GLUCOSE 140*  CALCIUM 8.5*   No results for input(s): LABPT, INR in the last 72 hours.  EXAM General - Patient is Alert, Appropriate and Oriented Extremity - Neurovascular intact Sensation intact distally Intact pulses distally Dorsiflexion/Plantar flexion intact Dressing - dressing C/D/I Motor Function - intact, moving foot and toes well on exam.  Hemovac pulled without difficulty.  Past Medical History:  Diagnosis Date  . Arthritis   . Chronic hip pain   . Prostate enlargement     Assessment/Plan: 1 Day Post-Op Procedure(s) (LRB): RIGHT TOTAL HIP ARTHROPLASTY ANTERIOR APPROACH (Right) Principal Problem:   OA  (osteoarthritis) of hip  Estimated body mass index is 27.17 kg/m as calculated from the following:   Height as of this encounter: 5\' 9"  (1.753 m).   Weight as of this encounter: 83.5 kg (184 lb). Up with therapy Discharge home with home health  DVT Prophylaxis - Xarelto Weight Bearing As Tolerated right Leg Hemovac Pulled Begin Therapy  If meets goals and able to go home: Discharge home with home health Diet - Regular diet Follow up - in 2 weeks Activity - WBAT Disposition - Home Condition Upon Discharge - Stable D/C Meds - See DC Summary DVT Prophylaxis - Xarelto  Arlee Muslim, PA-C Orthopaedic Surgery 12/24/2017, 11:46 AM

## 2017-12-24 NOTE — Progress Notes (Signed)
Patient still dtv at 1300. Patient denies feeling urge to void. Bladder scan complete reading 922ml. Patient requesting to walk to BRP to attempt urination and BM. Patient left in BR with instructions to call when done. Once patient done, therapy in with patient. Patient requested to walk with therapy prior to I&O cath, "Maybe walking will help." As soon as patient back in room and therapy complete, I&O cath performed. Patient tolerated well.

## 2017-12-25 LAB — BASIC METABOLIC PANEL
ANION GAP: 9 (ref 5–15)
BUN: 15 mg/dL (ref 6–20)
CHLORIDE: 105 mmol/L (ref 101–111)
CO2: 24 mmol/L (ref 22–32)
CREATININE: 0.95 mg/dL (ref 0.61–1.24)
Calcium: 8.7 mg/dL — ABNORMAL LOW (ref 8.9–10.3)
GFR calc non Af Amer: >60 mL/min (ref >60)
Glucose, Bld: 119 mg/dL — ABNORMAL HIGH (ref 65–99)
Potassium: 3.5 mmol/L (ref 3.5–5.1)
SODIUM: 138 mmol/L (ref 135–145)

## 2017-12-25 LAB — CBC
HCT: 31.2 % — ABNORMAL LOW (ref 39.0–52.0)
HEMOGLOBIN: 10.9 g/dL — AB (ref 13.0–17.0)
MCH: 31.7 pg (ref 26.0–34.0)
MCHC: 34.9 g/dL (ref 30.0–36.0)
MCV: 90.7 fL (ref 78.0–100.0)
Platelets: 260 10*3/uL (ref 150–400)
RBC: 3.44 MIL/uL — AB (ref 4.22–5.81)
RDW: 12.5 % (ref 11.5–15.5)
WBC: 15.4 10*3/uL — AB (ref 4.0–10.5)

## 2017-12-25 MED ORDER — OXYCODONE HCL 5 MG PO TABS: 5.0000 mg | ORAL_TABLET | ORAL | 0 refills | Status: DC | PRN

## 2017-12-25 MED ORDER — METHOCARBAMOL 500 MG PO TABS: 500.0000 mg | ORAL_TABLET | Freq: Four times a day (QID) | ORAL | 0 refills | Status: DC | PRN

## 2017-12-25 MED ORDER — RIVAROXABAN 10 MG PO TABS: 10.0000 mg | ORAL_TABLET | Freq: Every day | ORAL | 0 refills | Status: DC

## 2017-12-25 MED ORDER — TRAMADOL HCL 50 MG PO TABS: 50.0000 mg | ORAL_TABLET | Freq: Four times a day (QID) | ORAL | 0 refills | Status: DC | PRN

## 2017-12-25 NOTE — Progress Notes (Signed)
Patient discharged to home with family. Given all belongings, instructions, prescriptions, equipment. Wife present for all teaching. Both verbalized understanding of instructions. Escorted to pov via w/c.

## 2017-12-25 NOTE — Progress Notes (Signed)
   Subjective: 2 Days Post-Op Procedure(s) (LRB): RIGHT TOTAL HIP ARTHROPLASTY ANTERIOR APPROACH (Right) Patient reports pain as mild.   Patient seen in rounds by Dr. Wynelle Link. Patient is well, but has had some minor complaints of pain in the hip, requiring pain medications Patient is ready to go home following therapy goals  Objective: Vital signs in last 24 hours: Temp:  [98.1 F (36.7 C)-99 F (37.2 C)] 99 F (37.2 C) (01/23 0709) Pulse Rate:  [59-96] 63 (01/23 0709) Resp:  [15-17] 15 (01/23 0709) BP: (119-152)/(61-92) 152/92 (01/23 0709) SpO2:  [95 %-98 %] 98 % (01/23 0709)  Intake/Output from previous day:  Intake/Output Summary (Last 24 hours) at 12/25/2017 0719 Last data filed at 12/24/2017 2200 Gross per 24 hour  Intake 1951.84 ml  Output 2650 ml  Net -698.16 ml    Intake/Output this shift: No intake/output data recorded.  Labs: Recent Labs    12/24/17 0543 12/25/17 0537  HGB 11.1* 10.9*   Recent Labs    12/24/17 0543 12/25/17 0537  WBC 14.3* 15.4*  RBC 3.52* 3.44*  HCT 31.9* 31.2*  PLT 256 260   Recent Labs    12/24/17 0543 12/25/17 0537  NA 137 138  K 4.1 3.5  CL 106 105  CO2 25 24  BUN 10 15  CREATININE 1.15 0.95  GLUCOSE 140* 119*  CALCIUM 8.5* 8.7*   No results for input(s): LABPT, INR in the last 72 hours.  EXAM: General - Patient is Alert, Appropriate and Oriented Extremity - Neurovascular intact Sensation intact distally Intact pulses distally Incision - clean, dry, no drainage Motor Function - intact, moving foot and toes well on exam.   Assessment/Plan: 2 Days Post-Op Procedure(s) (LRB): RIGHT TOTAL HIP ARTHROPLASTY ANTERIOR APPROACH (Right) Procedure(s) (LRB): RIGHT TOTAL HIP ARTHROPLASTY ANTERIOR APPROACH (Right) Past Medical History:  Diagnosis Date  . Arthritis   . Chronic hip pain   . Prostate enlargement    Principal Problem:   OA (osteoarthritis) of hip  Estimated body mass index is 27.17 kg/m as calculated  from the following:   Height as of this encounter: 5\' 9"  (1.753 m).   Weight as of this encounter: 83.5 kg (184 lb). Up with therapy Diet - Regular diet Follow up - in 2 weeks Activity - WBAT Disposition - Home Condition Upon Discharge - Good D/C Meds - See DC Summary DVT Prophylaxis - Xarelto  Arlee Muslim, PA-C Orthopaedic Surgery 12/25/2017, 7:19 AM

## 2017-12-25 NOTE — Progress Notes (Signed)
Physical Therapy Treatment Patient Details Name: Luke Becker MRN: [id number] DOB: May 29, 1946 Today's Date: 12/25/2017    History of Present Illness RDATHA    PT Comments    POD # 2 Pt feeling better and voiding.  Spouse present during session for "hands on" instruction on all mobility.    Follow Up Recommendations  DC plan and follow up therapy as arranged by surgeon     Equipment Recommendations  Rolling walker with 5" wheels    Recommendations for Other Services       Precautions / Restrictions Precautions Precautions: Fall Restrictions Weight Bearing Restrictions: No    Mobility  Bed Mobility               General bed mobility comments: OOB in recliner   Transfers Overall transfer level: Needs assistance Equipment used: Rolling walker (2 wheeled) Transfers: Sit to/from Stand Sit to Stand: Supervision         General transfer comment: <25% VC's on safety with turns and proper hand placement with stand to sit  Ambulation/Gait   Ambulation Distance (Feet): 225 Feet Assistive device: Rolling walker (2 wheeled) Gait Pattern/deviations: Step-to pattern;Step-through pattern;Antalgic Gait velocity: decreased   General Gait Details: <25% VC's safety with turns   Stairs Stairs: Yes   Stair Management: One rail Right;Step to pattern;Forwards Number of Stairs: 4 General stair comments: with spouse present for "hands on" instruction on proper tech  Wheelchair Mobility    Modified Rankin (Stroke Patients Only)       Balance                                            Cognition Arousal/Alertness: Awake/alert Behavior During Therapy: WFL for tasks assessed/performed Overall Cognitive Status: Within Functional Limits for tasks assessed                                        Exercises      General Comments        Pertinent Vitals/Pain Pain Assessment: 0-10 Pain Score: 3  Pain Location: right  thigh Pain Descriptors / Indicators: Sore;Operative site guarding Pain Intervention(s): Monitored during session;Premedicated before session;Repositioned;Ice applied    Home Living                      Prior Function            PT Goals (current goals can now be found in the care plan section) Progress towards PT goals: Progressing toward goals    Frequency    7X/week      PT Plan Current plan remains appropriate    Co-evaluation              AM-PAC PT "6 Clicks" Daily Activity  Outcome Measure  Difficulty turning over in bed (including adjusting bedclothes, sheets and blankets)?: A Little Difficulty moving from lying on back to sitting on the side of the bed? : A Little Difficulty sitting down on and standing up from a chair with arms (e.g., wheelchair, bedside commode, etc,.)?: A Little Help needed moving to and from a bed to chair (including a wheelchair)?: A Little Help needed walking in hospital room?: A Little Help needed climbing 3-5 steps with a railing? : A Little 6 Click Score: 18  End of Session Equipment Utilized During Treatment: Gait belt Activity Tolerance: Patient tolerated treatment well Patient left: in chair;with call bell/phone within reach Nurse Communication: Mobility status PT Visit Diagnosis: Unsteadiness on feet (R26.81);Pain Pain - Right/Left: Right Pain - part of body: Hip     Time: 9024-0973 PT Time Calculation (min) (ACUTE ONLY): 26 min  Charges:  $Gait Training: 8-22 mins $Therapeutic Activity: 8-22 mins                    G Codes:       Rica Koyanagi  PTA [facility]  Acute  Rehab Pager      [phone]

## 2018-11-18 NOTE — H&P (Signed)
TOTAL HIP ADMISSION H&P  Patient is admitted for left total hip arthroplasty.  Subjective:  Chief Complaint: left hip pain  HPI: Luke Becker, 72 y.o. male, has a history of pain and functional disability in the left hip(s) due to arthritis and patient has failed non-surgical conservative treatments for greater than 12 weeks to include NSAID's and/or analgesics, corticosteriod injections, use of assistive devices and activity modification.  Onset of symptoms was abrupt starting 5 months ago with rapidly worsening course since that time.The patient noted no past surgery on the left hip(s).  Patient currently rates pain in the left hip at 7 out of 10 with activity. Patient has night pain, worsening of pain with activity and weight bearing and pain that interfers with activities of daily living. Patient has evidence of bone-on-bone arthritis with subchondral cystic formation and marginal osteophyte formation by imaging studies. This condition presents safety issues increasing the risk of falls. There is no current active infection.  Patient Active Problem List   Diagnosis Date Noted  . OA (osteoarthritis) of hip 12/23/2017   Past Medical History:  Diagnosis Date  . Arthritis   . Chronic hip pain   . Prostate enlargement     Past Surgical History:  Procedure Laterality Date  . COLONOSCOPY    . TONSILLECTOMY    . TOTAL HIP ARTHROPLASTY Right 12/23/2017   Procedure: RIGHT TOTAL HIP ARTHROPLASTY ANTERIOR APPROACH;  Surgeon: [name], MD;  Location: [facility];  Service: Orthopedics;  Laterality: Right;    No current facility-administered medications for this encounter.    Current Outpatient Medications  Medication Sig Dispense Refill Last Dose  . acetaminophen (TYLENOL) 500 MG tablet Take 1,000 mg by mouth every 6 (six) hours as needed for moderate pain or headache.   12/22/2017 at 2000  . doxazosin (CARDURA) 4 MG tablet Take 4 mg by mouth daily.   12/22/2017 at 2000  . finasteride  (PROSCAR) 5 MG tablet Take 5 mg by mouth daily.   12/23/2017 at 0600  . methocarbamol (ROBAXIN) 500 MG tablet Take 1 tablet (500 mg total) by mouth every 6 (six) hours as needed for muscle spasms. 60 tablet 0   . oxyCODONE (OXY IR/ROXICODONE) 5 MG immediate release tablet Take 1-2 tablets (5-10 mg total) by mouth every 4 (four) hours as needed for moderate pain or severe pain. 60 tablet 0   . pantoprazole (PROTONIX) 40 MG tablet Take 40 mg by mouth daily as needed (for acid reflux or heartburn).   12/23/2017 at 0600  . rivaroxaban (XARELTO) 10 MG TABS tablet Take 1 tablet (10 mg total) by mouth daily with breakfast. Take Xarelto for two and a half more weeks following discharge from the hospital, then discontinue Xarelto. Once the patient has completed the blood thinner regimen, then take a Baby 81 mg Aspirin daily for three more weeks. 20 tablet 0   . traMADol (ULTRAM) 50 MG tablet Take 1-2 tablets (50-100 mg total) by mouth every 6 (six) hours as needed (mild pain). 56 tablet 0    Allergies  Allergen Reactions  . Azithromycin Other (See Comments)    Upset stomach    Social History   Tobacco Use  . Smoking status: Never Smoker  . Smokeless tobacco: Never Used  Substance Use Topics  . Alcohol use: Yes    Alcohol/week: 2.0 standard drinks    Types: 2 Glasses of wine per week    Comment: 2 glasses red wine nightly     No family history on  file.   Review of Systems  Constitutional: Negative for chills and fever.  HENT: Negative for congestion, sore throat and tinnitus.   Eyes: Negative for double vision, photophobia and pain.  Respiratory: Negative for cough, shortness of breath and wheezing.   Cardiovascular: Negative for chest pain, palpitations and orthopnea.  Gastrointestinal: Negative for heartburn, nausea and vomiting.  Genitourinary: Negative for dysuria, frequency and urgency.  Musculoskeletal: Positive for joint pain.  Neurological: Negative for dizziness, weakness and  headaches.    Objective:  Physical Exam  Well nourished and well developed.  General: Alert and oriented x3, cooperative and pleasant, no acute distress.  Head: normocephalic, atraumatic, neck supple.  Eyes: EOMI.  Respiratory: breath sounds clear in all fields, no wheezing, rales, or rhonchi. Cardiovascular: Regular rate and rhythm, no murmurs, gallops or rubs.  Abdomen: non-tender to palpation and soft, normoactive bowel sounds. Musculoskeletal: Left Hip Exam: ROM: Flexion to 110, Internal Rotation is minimal, External Rotation 20, and Abduction 20-30 degrees. There is no tenderness over the greater trochanter bursa.  Calves soft and nontender. Motor function intact in LE. Strength 5/5 LE bilaterally. Neuro: Distal pulses 2+. Sensation to light touch intact in LE.  Vital signs in last 24 hours: Blood pressure: 126/74 mmHg Pulse: 60 bpm  Labs:   Estimated body mass index is 27.17 kg/m as calculated from the following:   Height as of 12/23/17: 5\' 9"  (1.753 m).   Weight as of 12/23/17: 83.5 kg.   Imaging Review Plain radiographs demonstrate severe degenerative joint disease of the left hip(s). The bone quality appears to be adequate for age and reported activity level.    Preoperative templating of the joint replacement has been completed, documented, and submitted to the Operating Room personnel in order to optimize intra-operative equipment management.     Assessment/Plan:  End stage arthritis, left hip(s)  The patient history, physical examination, clinical judgement of the provider and imaging studies are consistent with end stage degenerative joint disease of the left hip(s) and total hip arthroplasty is deemed medically necessary. The treatment options including medical management, injection therapy, arthroscopy and arthroplasty were discussed at length. The risks and benefits of total hip arthroplasty were presented and reviewed. The risks due to aseptic loosening,  infection, stiffness, dislocation/subluxation,  thromboembolic complications and other imponderables were discussed.  The patient acknowledged the explanation, agreed to proceed with the plan and consent was signed. Patient is being admitted for inpatient treatment for surgery, pain control, PT, OT, prophylactic antibiotics, VTE prophylaxis, progressive ambulation and ADL's and discharge planning.The patient is planning to be discharged home.   Therapy Plans: HEP Disposition: Home with friend Planned DVT Prophylaxis: Aspirin 325 mg BID DME needed: None PCP: Dineen Kid, MD TXA: IV Allergies: NKDA Anesthesia Concerns: None BMI: 25.8  - Patient was instructed on what medications to stop prior to surgery. - Follow-up visit in 2 weeks with Dr. Wynelle Link - Begin physical therapy following surgery - Pre-operative lab work as pre-surgical testing - Prescriptions will be provided in hospital at time of discharge  Theresa Duty, PA-C Orthopedic Surgery [facility] [place]

## 2018-12-10 ENCOUNTER — Encounter (HOSPITAL_COMMUNITY): Payer: Self-pay

## 2018-12-10 NOTE — Patient Instructions (Addendum)
Your procedure is scheduled on: Wednesday, Jan. 15, 2020   Surgery Time:  1:45PM-3:25PM   Report to [place]  Entrance    Report to admitting at 11:15 AM   Call this number if you have problems the morning of surgery [phone]   Do not eat food or drink liquids :After Midnight.   Brush your teeth the morning of surgery.   Do NOT smoke after Midnight   Take these medicines the morning of surgery with A SIP OF WATER: Finasteride, Pantoprazole                               You may not have any metal on your body including jewelry, and body piercings             Do not wear lotions, powders, perfumes/cologne, or deodorant                          Men may shave face and neck.   Do not bring valuables to the hospital. [place].   Contacts, dentures or bridgework may not be worn into surgery.   Leave suitcase in the car. After surgery it may be brought to your room.    Special Instructions: Bring a copy of your healthcare power of attorney and living will documents         the day of surgery if you haven't scanned them in before.              Please read over the following fact sheets you were given:  [facility] - Preparing for Surgery Before surgery, you can play an important role.  Because skin is not sterile, your skin needs to be as free of germs as possible.  You can reduce the number of germs on your skin by washing with CHG (chlorahexidine gluconate) soap before surgery.  CHG is an antiseptic cleaner which kills germs and bonds with the skin to continue killing germs even after washing. Please DO NOT use if you have an allergy to CHG or antibacterial soaps.  If your skin becomes reddened/irritated stop using the CHG and inform your nurse when you arrive at Short Stay. Do not shave (including legs and underarms) for at least 48 hours prior to the first CHG shower.  You may shave your face/neck.  Please  follow these instructions carefully:  1.  Shower with CHG Soap the night before surgery and the  morning of surgery.  2.  If you choose to wash your hair, wash your hair first as usual with your normal  shampoo.  3.  After you shampoo, rinse your hair and body thoroughly to remove the shampoo.                             4.  Use CHG as you would any other liquid soap.  You can apply chg directly to the skin and wash.  Gently with a scrungie or clean washcloth.  5.  Apply the CHG Soap to your body ONLY FROM THE NECK DOWN.   Do   not use on face/ open  Wound or open sores. Avoid contact with eyes, ears mouth and   genitals (private parts).                       Wash face,  Genitals (private parts) with your normal soap.             6.  Wash thoroughly, paying special attention to the area where your    surgery  will be performed.  7.  Thoroughly rinse your body with warm water from the neck down.  8.  DO NOT shower/wash with your normal soap after using and rinsing off the CHG Soap.                9.  Pat yourself dry with a clean towel.            10.  Wear clean pajamas.            11.  Place clean sheets on your bed the night of your first shower and do not  sleep with pets. Day of Surgery : Do not apply any lotions/deodorants the morning of surgery.  Please wear clean clothes to the hospital/surgery center.  FAILURE TO FOLLOW THESE INSTRUCTIONS MAY RESULT IN THE CANCELLATION OF YOUR SURGERY  PATIENT SIGNATURE_________________________________  NURSE SIGNATURE__________________________________  ________________________________________________________________________   Luke Becker  An incentive spirometer is a tool that can help keep your lungs clear and active. This tool measures how well you are filling your lungs with each breath. Taking long deep breaths may help reverse or decrease the chance of developing breathing (pulmonary) problems (especially  infection) following:  A long period of time when you are unable to move or be active. BEFORE THE PROCEDURE   If the spirometer includes an indicator to show your best effort, your nurse or respiratory therapist will set it to a desired goal.  If possible, sit up straight or lean slightly forward. Try not to slouch.  Hold the incentive spirometer in an upright position. INSTRUCTIONS FOR USE  1. Sit on the edge of your bed if possible, or sit up as far as you can in bed or on a chair. 2. Hold the incentive spirometer in an upright position. 3. Breathe out normally. 4. Place the mouthpiece in your mouth and seal your lips tightly around it. 5. Breathe in slowly and as deeply as possible, raising the piston or the ball toward the top of the column. 6. Hold your breath for 3-5 seconds or for as long as possible. Allow the piston or ball to fall to the bottom of the column. 7. Remove the mouthpiece from your mouth and breathe out normally. 8. Rest for a few seconds and repeat Steps 1 through 7 at least 10 times every 1-2 hours when you are awake. Take your time and take a few normal breaths between deep breaths. 9. The spirometer may include an indicator to show your best effort. Use the indicator as a goal to work toward during each repetition. 10. After each set of 10 deep breaths, practice coughing to be sure your lungs are clear. If you have an incision (the cut made at the time of surgery), support your incision when coughing by placing a pillow or rolled up towels firmly against it. Once you are able to get out of bed, walk around indoors and cough well. You may stop using the incentive spirometer when instructed by your caregiver.  RISKS AND COMPLICATIONS  Take your time  so you do not get dizzy or light-headed.  If you are in pain, you may need to take or ask for pain medication before doing incentive spirometry. It is harder to take a deep breath if you are having pain. AFTER  USE  Rest and breathe slowly and easily.  It can be helpful to keep track of a log of your progress. Your caregiver can provide you with a simple table to help with this. If you are using the spirometer at home, follow these instructions: [place] IF:   You are having difficultly using the spirometer.  You have trouble using the spirometer as often as instructed.  Your pain medication is not giving enough relief while using the spirometer.  You develop fever of 100.5 F (38.1 C) or higher. SEEK IMMEDIATE MEDICAL CARE IF:   You cough up bloody sputum that had not been present before.  You develop fever of 102 F (38.9 C) or greater.  You develop worsening pain at or near the incision site. MAKE SURE YOU:   Understand these instructions.  Will watch your condition.  Will get help right away if you are not doing well or get worse. Document Released: 04/01/2007 Document Revised: 02/11/2012 Document Reviewed: 06/02/2007 Exit[employer] Patient Information 2014 Exit[employer], [place].   ________________________________________________________________________  WHAT IS A BLOOD TRANSFUSION? Blood Transfusion Information  A transfusion is the replacement of blood or some of its parts. Blood is made up of multiple cells which provide different functions.  Red blood cells carry oxygen and are used for blood loss replacement.  White blood cells fight against infection.  Platelets control bleeding.  Plasma helps clot blood.  Other blood products are available for specialized needs, such as hemophilia or other clotting disorders. BEFORE THE TRANSFUSION  Who gives blood for transfusions?   Healthy volunteers who are fully evaluated to make sure their blood is safe. This is blood bank blood. Transfusion therapy is the safest it has ever been in the practice of medicine. Before blood is taken from a donor, a complete history is taken to make sure that person has no history of diseases  nor engages in risky social behavior (examples are intravenous drug use or sexual activity with multiple partners). The donor's travel history is screened to minimize risk of transmitting infections, such as malaria. The donated blood is tested for signs of infectious diseases, such as HIV and hepatitis. The blood is then tested to be sure it is compatible with you in order to minimize the chance of a transfusion reaction. If you or a relative donates blood, this is often done in anticipation of surgery and is not appropriate for emergency situations. It takes many days to process the donated blood. RISKS AND COMPLICATIONS Although transfusion therapy is very safe and saves many lives, the main dangers of transfusion include:   Getting an infectious disease.  Developing a transfusion reaction. This is an allergic reaction to something in the blood you were given. Every precaution is taken to prevent this. The decision to have a blood transfusion has been considered carefully by your caregiver before blood is given. Blood is not given unless the benefits outweigh the risks. AFTER THE TRANSFUSION  Right after receiving a blood transfusion, you will usually feel much better and more energetic. This is especially true if your red blood cells have gotten low (anemic). The transfusion raises the level of the red blood cells which carry oxygen, and this usually causes an energy increase.  The  nurse administering the transfusion will monitor you carefully for complications. HOME CARE INSTRUCTIONS  No special instructions are needed after a transfusion. You may find your energy is better. Speak with your caregiver about any limitations on activity for underlying diseases you may have. SEEK MEDICAL CARE IF:   Your condition is not improving after your transfusion.  You develop redness or irritation at the intravenous (IV) site. SEEK IMMEDIATE MEDICAL CARE IF:  Any of the following symptoms occur over the  next 12 hours:  Shaking chills.  You have a temperature by mouth above 102 F (38.9 C), not controlled by medicine.  Chest, back, or muscle pain.  People around you feel you are not acting correctly or are confused.  Shortness of breath or difficulty breathing.  Dizziness and fainting.  You get a rash or develop hives.  You have a decrease in urine output.  Your urine turns a dark color or changes to pink, red, or brown. Any of the following symptoms occur over the next 10 days:  You have a temperature by mouth above 102 F (38.9 C), not controlled by medicine.  Shortness of breath.  Weakness after normal activity.  The white part of the eye turns yellow (jaundice).  You have a decrease in the amount of urine or are urinating less often.  Your urine turns a dark color or changes to pink, red, or brown. Document Released: 11/16/2000 Document Revised: 02/11/2012 Document Reviewed: 07/05/2008 [facility] Patient Information 2014 [place], [place].  _______________________________________________________________________

## 2018-12-10 NOTE — Pre-Procedure Instructions (Signed)
The following are in the chart: Surgical clearance Dr. Lennette Bihari Via 10/27/2018 EKG 10/23/2018 Hgb A1C (5.4) 10/27/2018

## 2018-12-12 ENCOUNTER — Encounter (HOSPITAL_COMMUNITY)
Admission: RE | Admit: 2018-12-12 | Discharge: 2018-12-12 | Disposition: A | Discharge status: Home / Self Care | Payer: Medicare PPO | Source: Ambulatory Visit | Attending: Orthopedic Surgery | Admitting: Orthopedic Surgery

## 2018-12-12 ENCOUNTER — Encounter (HOSPITAL_COMMUNITY): Payer: Self-pay

## 2018-12-12 ENCOUNTER — Other Ambulatory Visit: Payer: Self-pay

## 2018-12-12 DIAGNOSIS — Z01812 Encounter for preprocedural laboratory examination: Secondary | ICD-10-CM | POA: Diagnosis not present

## 2018-12-12 HISTORY — DX: Benign prostatic hyperplasia without lower urinary tract symptoms: N40.0

## 2018-12-12 HISTORY — DX: Gastro-esophageal reflux disease without esophagitis: K21.9

## 2018-12-12 HISTORY — DX: Diverticulosis of intestine, part unspecified, without perforation or abscess without bleeding: K57.90

## 2018-12-12 HISTORY — DX: Bradycardia, unspecified: R00.1

## 2018-12-12 HISTORY — DX: Unspecified osteoarthritis, unspecified site: M19.90

## 2018-12-12 HISTORY — DX: Personal history of (healed) traumatic fracture: Z87.81

## 2018-12-12 LAB — CBC WITH DIFFERENTIAL/PLATELET
Abs Immature Granulocytes: 0.02 10*3/uL (ref 0.00–0.07)
Basophils Absolute: 0.1 10*3/uL (ref 0.0–0.1)
Basophils Relative: 1 %
Eosinophils Absolute: 0 10*3/uL (ref 0.0–0.5)
Eosinophils Relative: 1 %
HCT: 41.8 % (ref 39.0–52.0)
Hemoglobin: 13.9 g/dL (ref 13.0–17.0)
Immature Granulocytes: 0 %
Lymphocytes Relative: 21 %
Lymphs Abs: 1.3 10*3/uL (ref 0.7–4.0)
MCH: 30.8 pg (ref 26.0–34.0)
MCHC: 33.3 g/dL (ref 30.0–36.0)
MCV: 92.7 fL (ref 80.0–100.0)
Monocytes Absolute: 0.7 10*3/uL (ref 0.1–1.0)
Monocytes Relative: 12 %
Neutro Abs: 4.1 10*3/uL (ref 1.7–7.7)
Neutrophils Relative %: 65 %
Platelets: 294 10*3/uL (ref 150–400)
RBC: 4.51 MIL/uL (ref 4.22–5.81)
RDW: 12 % (ref 11.5–15.5)
WBC: 6.2 10*3/uL (ref 4.0–10.5)
nRBC: 0 % (ref 0.0–0.2)

## 2018-12-12 LAB — COMPREHENSIVE METABOLIC PANEL
ALT: 44 U/L (ref 0–44)
AST: 30 U/L (ref 15–41)
Albumin: 4.4 g/dL (ref 3.5–5.0)
Alkaline Phosphatase: 66 U/L (ref 38–126)
Anion gap: 10 (ref 5–15)
BUN: 17 mg/dL (ref 8–23)
CO2: 28 mmol/L (ref 22–32)
Calcium: 9.1 mg/dL (ref 8.9–10.3)
Chloride: 102 mmol/L (ref 98–111)
Creatinine, Ser: 1.3 mg/dL — ABNORMAL HIGH (ref 0.61–1.24)
GFR calc Af Amer: >60 mL/min (ref >60)
GFR calc non Af Amer: 55 mL/min — ABNORMAL LOW (ref >60)
Glucose, Bld: 100 mg/dL — ABNORMAL HIGH (ref 70–99)
Potassium: 4.1 mmol/L (ref 3.5–5.1)
Sodium: 140 mmol/L (ref 135–145)
Total Bilirubin: 1.4 mg/dL — ABNORMAL HIGH (ref 0.3–1.2)
Total Protein: 7.3 g/dL (ref 6.5–8.1)

## 2018-12-12 LAB — URINALYSIS, ROUTINE W REFLEX MICROSCOPIC
Bilirubin Urine: NEGATIVE
Glucose, UA: NEGATIVE mg/dL
Ketones, ur: NEGATIVE mg/dL
Nitrite: NEGATIVE
Protein, ur: NEGATIVE mg/dL
Specific Gravity, Urine: 1.01 (ref 1.005–1.030)
WBC, UA: >50 WBC/hpf — ABNORMAL HIGH (ref 0–5)
pH: 7 (ref 5.0–8.0)

## 2018-12-12 LAB — PROTIME-INR
INR: 1.01
Prothrombin Time: 13.2 seconds (ref 11.4–15.2)

## 2018-12-12 LAB — APTT: aPTT: 36 seconds (ref 24–36)

## 2018-12-12 LAB — SURGICAL PCR SCREEN
MRSA, PCR: NEGATIVE
Staphylococcus aureus: POSITIVE — AB

## 2018-12-12 NOTE — Pre-Procedure Instructions (Signed)
CMP, UA, and PCR results 12/12/2018 sent to Dr. Wynelle Link via epic.

## 2018-12-17 ENCOUNTER — Inpatient Hospital Stay (HOSPITAL_COMMUNITY)
Admission: RE | Admit: 2018-12-17 | Discharge: 2018-12-18 | DRG: 470 | Disposition: A | Discharge status: Home / Self Care | Payer: Medicare PPO | Attending: Orthopedic Surgery | Admitting: Orthopedic Surgery

## 2018-12-17 ENCOUNTER — Encounter (HOSPITAL_COMMUNITY)
Admission: RE | Disposition: A | Discharge status: Home / Self Care | Payer: Self-pay | Source: Home / Self Care | Attending: Orthopedic Surgery

## 2018-12-17 ENCOUNTER — Inpatient Hospital Stay (HOSPITAL_COMMUNITY): Payer: Medicare PPO | Admitting: Certified Registered"

## 2018-12-17 ENCOUNTER — Inpatient Hospital Stay (HOSPITAL_COMMUNITY): Payer: Medicare PPO

## 2018-12-17 ENCOUNTER — Other Ambulatory Visit: Payer: Self-pay

## 2018-12-17 ENCOUNTER — Inpatient Hospital Stay (HOSPITAL_COMMUNITY): Payer: Medicare PPO | Admitting: Physician Assistant

## 2018-12-17 ENCOUNTER — Encounter (HOSPITAL_COMMUNITY): Payer: Self-pay | Admitting: *Deleted

## 2018-12-17 DIAGNOSIS — M1612 Unilateral primary osteoarthritis, left hip: Secondary | ICD-10-CM

## 2018-12-17 DIAGNOSIS — Z96641 Presence of right artificial hip joint: Secondary | ICD-10-CM | POA: Diagnosis present

## 2018-12-17 DIAGNOSIS — Z7901 Long term (current) use of anticoagulants: Secondary | ICD-10-CM

## 2018-12-17 DIAGNOSIS — M169 Osteoarthritis of hip, unspecified: Secondary | ICD-10-CM | POA: Diagnosis present

## 2018-12-17 DIAGNOSIS — K219 Gastro-esophageal reflux disease without esophagitis: Secondary | ICD-10-CM | POA: Diagnosis present

## 2018-12-17 DIAGNOSIS — Z96649 Presence of unspecified artificial hip joint: Secondary | ICD-10-CM

## 2018-12-17 DIAGNOSIS — Z79899 Other long term (current) drug therapy: Secondary | ICD-10-CM

## 2018-12-17 DIAGNOSIS — Z881 Allergy status to other antibiotic agents status: Secondary | ICD-10-CM | POA: Diagnosis not present

## 2018-12-17 DIAGNOSIS — Z419 Encounter for procedure for purposes other than remedying health state, unspecified: Secondary | ICD-10-CM

## 2018-12-17 DIAGNOSIS — M25752 Osteophyte, left hip: Secondary | ICD-10-CM | POA: Diagnosis present

## 2018-12-17 DIAGNOSIS — N400 Enlarged prostate without lower urinary tract symptoms: Secondary | ICD-10-CM | POA: Diagnosis present

## 2018-12-17 DIAGNOSIS — Z79891 Long term (current) use of opiate analgesic: Secondary | ICD-10-CM | POA: Diagnosis not present

## 2018-12-17 DIAGNOSIS — G8929 Other chronic pain: Secondary | ICD-10-CM | POA: Diagnosis present

## 2018-12-17 HISTORY — PX: TOTAL HIP ARTHROPLASTY: SHX124

## 2018-12-17 LAB — TYPE AND SCREEN
ABO/RH(D): O POS
Antibody Screen: NEGATIVE

## 2018-12-17 SURGERY — ARTHROPLASTY, HIP, TOTAL, ANTERIOR APPROACH
Anesthesia: Spinal | Site: Hip | Laterality: Left

## 2018-12-17 MED ORDER — FLEET ENEMA 7-19 GM/118ML RE ENEM: 1.0000 | ENEMA | Freq: Once | RECTAL | Status: DC | PRN

## 2018-12-17 MED ORDER — FINASTERIDE 5 MG PO TABS
5.0000 mg | ORAL_TABLET | Freq: Every day | ORAL | Status: DC
  Administered 2018-12-18: 5 mg via ORAL
  Filled 2018-12-17: qty 1

## 2018-12-17 MED ORDER — TRAMADOL HCL 50 MG PO TABS
50.0000 mg | ORAL_TABLET | Freq: Four times a day (QID) | ORAL | Status: DC | PRN
  Administered 2018-12-18: 100 mg via ORAL
  Filled 2018-12-17: qty 2

## 2018-12-17 MED ORDER — DOCUSATE SODIUM 100 MG PO CAPS
100.0000 mg | ORAL_CAPSULE | Freq: Two times a day (BID) | ORAL | Status: DC
  Administered 2018-12-17 – 2018-12-18 (×2): 100 mg via ORAL
  Filled 2018-12-17 (×2): qty 1

## 2018-12-17 MED ORDER — PROPOFOL 10 MG/ML IV BOLUS
INTRAVENOUS | Status: DC | PRN
  Administered 2018-12-17 (×3): 20 mg via INTRAVENOUS

## 2018-12-17 MED ORDER — DEXAMETHASONE SODIUM PHOSPHATE 10 MG/ML IJ SOLN
10.0000 mg | Freq: Once | INTRAMUSCULAR | Status: AC
  Administered 2018-12-17: 10 mg via INTRAVENOUS

## 2018-12-17 MED ORDER — DEXAMETHASONE SODIUM PHOSPHATE 10 MG/ML IJ SOLN
10.0000 mg | Freq: Once | INTRAMUSCULAR | Status: AC
  Administered 2018-12-18: 10 mg via INTRAVENOUS
  Filled 2018-12-17: qty 1

## 2018-12-17 MED ORDER — CHLORHEXIDINE GLUCONATE 4 % EX LIQD: 60.0000 mL | Freq: Once | CUTANEOUS | Status: DC

## 2018-12-17 MED ORDER — FENTANYL CITRATE (PF) 100 MCG/2ML IJ SOLN
INTRAMUSCULAR | Status: AC
  Filled 2018-12-17: qty 2

## 2018-12-17 MED ORDER — METHOCARBAMOL 500 MG PO TABS
500.0000 mg | ORAL_TABLET | Freq: Four times a day (QID) | ORAL | Status: DC | PRN
  Administered 2018-12-17 – 2018-12-18 (×2): 500 mg via ORAL
  Filled 2018-12-17 (×2): qty 1

## 2018-12-17 MED ORDER — METOCLOPRAMIDE HCL 5 MG/ML IJ SOLN: 5.0000 mg | Freq: Three times a day (TID) | INTRAMUSCULAR | Status: DC | PRN

## 2018-12-17 MED ORDER — HYDROCODONE-ACETAMINOPHEN 5-325 MG PO TABS
1.0000 | ORAL_TABLET | ORAL | Status: DC | PRN
  Administered 2018-12-17 – 2018-12-18 (×4): 2 via ORAL
  Filled 2018-12-17 (×5): qty 2

## 2018-12-17 MED ORDER — ONDANSETRON HCL 4 MG/2ML IJ SOLN: 4.0000 mg | Freq: Four times a day (QID) | INTRAMUSCULAR | Status: DC | PRN

## 2018-12-17 MED ORDER — BUPIVACAINE-EPINEPHRINE (PF) 0.25% -1:200000 IJ SOLN
INTRAMUSCULAR | Status: DC | PRN
  Administered 2018-12-17: 30 mL

## 2018-12-17 MED ORDER — PHENOL 1.4 % MT LIQD
1.0000 | OROMUCOSAL | Status: DC | PRN
  Filled 2018-12-17: qty 177

## 2018-12-17 MED ORDER — PROPOFOL 500 MG/50ML IV EMUL
INTRAVENOUS | Status: DC | PRN
  Administered 2018-12-17: 125 ug/kg/min via INTRAVENOUS

## 2018-12-17 MED ORDER — FENTANYL CITRATE (PF) 100 MCG/2ML IJ SOLN
25.0000 ug | INTRAMUSCULAR | Status: DC | PRN
  Administered 2018-12-17 (×3): 50 ug via INTRAVENOUS

## 2018-12-17 MED ORDER — POLYETHYLENE GLYCOL 3350 17 G PO PACK: 17.0000 g | PACK | Freq: Every day | ORAL | Status: DC | PRN

## 2018-12-17 MED ORDER — LACTATED RINGERS IV SOLN
INTRAVENOUS | Status: DC
  Administered 2018-12-17 (×2): via INTRAVENOUS

## 2018-12-17 MED ORDER — EPHEDRINE 5 MG/ML INJ
INTRAVENOUS | Status: AC
  Filled 2018-12-17: qty 10

## 2018-12-17 MED ORDER — EPHEDRINE SULFATE-NACL 50-0.9 MG/10ML-% IV SOSY
PREFILLED_SYRINGE | INTRAVENOUS | Status: DC | PRN
  Administered 2018-12-17 (×3): 5 mg via INTRAVENOUS

## 2018-12-17 MED ORDER — ACETAMINOPHEN 500 MG PO TABS
500.0000 mg | ORAL_TABLET | Freq: Four times a day (QID) | ORAL | Status: DC
  Administered 2018-12-17 – 2018-12-18 (×2): 500 mg via ORAL
  Filled 2018-12-17 (×3): qty 1

## 2018-12-17 MED ORDER — PROPOFOL 10 MG/ML IV BOLUS
INTRAVENOUS | Status: AC
  Filled 2018-12-17: qty 80

## 2018-12-17 MED ORDER — HYDROMORPHONE HCL 1 MG/ML IJ SOLN
0.2500 mg | INTRAMUSCULAR | Status: DC | PRN
  Administered 2018-12-17 (×2): 0.5 mg via INTRAVENOUS

## 2018-12-17 MED ORDER — ONDANSETRON HCL 4 MG/2ML IJ SOLN
INTRAMUSCULAR | Status: DC | PRN
  Administered 2018-12-17: 4 mg via INTRAVENOUS

## 2018-12-17 MED ORDER — DIPHENHYDRAMINE HCL 12.5 MG/5ML PO ELIX: 12.5000 mg | ORAL_SOLUTION | ORAL | Status: DC | PRN

## 2018-12-17 MED ORDER — BISACODYL 10 MG RE SUPP: 10.0000 mg | Freq: Every day | RECTAL | Status: DC | PRN

## 2018-12-17 MED ORDER — MORPHINE SULFATE (PF) 4 MG/ML IV SOLN
0.5000 mg | INTRAVENOUS | Status: DC | PRN
  Administered 2018-12-17: 1 mg via INTRAVENOUS
  Filled 2018-12-17 (×2): qty 1

## 2018-12-17 MED ORDER — SODIUM CHLORIDE 0.9 % IV SOLN
INTRAVENOUS | Status: DC
  Administered 2018-12-17 – 2018-12-18 (×2): via INTRAVENOUS

## 2018-12-17 MED ORDER — METHOCARBAMOL 500 MG IVPB - SIMPLE MED
INTRAVENOUS | Status: AC
  Filled 2018-12-17: qty 50

## 2018-12-17 MED ORDER — DOXAZOSIN MESYLATE 4 MG PO TABS
4.0000 mg | ORAL_TABLET | Freq: Every evening | ORAL | Status: DC
  Administered 2018-12-17: 4 mg via ORAL
  Filled 2018-12-17 (×2): qty 1

## 2018-12-17 MED ORDER — METOCLOPRAMIDE HCL 5 MG/ML IJ SOLN: 10.0000 mg | Freq: Once | INTRAMUSCULAR | Status: DC | PRN

## 2018-12-17 MED ORDER — ONDANSETRON HCL 4 MG PO TABS: 4.0000 mg | ORAL_TABLET | Freq: Four times a day (QID) | ORAL | Status: DC | PRN

## 2018-12-17 MED ORDER — HYDROMORPHONE HCL 1 MG/ML IJ SOLN
INTRAMUSCULAR | Status: AC
  Filled 2018-12-17: qty 1

## 2018-12-17 MED ORDER — ASPIRIN EC 325 MG PO TBEC
325.0000 mg | DELAYED_RELEASE_TABLET | Freq: Two times a day (BID) | ORAL | Status: DC
  Administered 2018-12-18: 325 mg via ORAL
  Filled 2018-12-17: qty 1

## 2018-12-17 MED ORDER — ACETAMINOPHEN 10 MG/ML IV SOLN
1000.0000 mg | Freq: Once | INTRAVENOUS | Status: AC
  Administered 2018-12-17: 1000 mg via INTRAVENOUS
  Filled 2018-12-17: qty 100

## 2018-12-17 MED ORDER — METOCLOPRAMIDE HCL 5 MG PO TABS: 5.0000 mg | ORAL_TABLET | Freq: Three times a day (TID) | ORAL | Status: DC | PRN

## 2018-12-17 MED ORDER — CEFAZOLIN SODIUM-DEXTROSE 2-4 GM/100ML-% IV SOLN
2.0000 g | INTRAVENOUS | Status: AC
Start: 2018-12-17 — End: 2018-12-17
  Administered 2018-12-17: 2 g via INTRAVENOUS
  Filled 2018-12-17: qty 100

## 2018-12-17 MED ORDER — PANTOPRAZOLE SODIUM 40 MG PO TBEC: 40.0000 mg | DELAYED_RELEASE_TABLET | Freq: Every day | ORAL | Status: DC | PRN

## 2018-12-17 MED ORDER — TRANEXAMIC ACID-NACL 1000-0.7 MG/100ML-% IV SOLN
1000.0000 mg | Freq: Once | INTRAVENOUS | Status: AC
  Administered 2018-12-17: 1000 mg via INTRAVENOUS
  Filled 2018-12-17: qty 100

## 2018-12-17 MED ORDER — FENTANYL CITRATE (PF) 250 MCG/5ML IJ SOLN
INTRAMUSCULAR | Status: DC | PRN
  Administered 2018-12-17: 50 ug via INTRAVENOUS
  Administered 2018-12-17: 25 ug via INTRAVENOUS
  Administered 2018-12-17 (×2): 50 ug via INTRAVENOUS
  Administered 2018-12-17: 25 ug via INTRAVENOUS

## 2018-12-17 MED ORDER — MEPERIDINE HCL 50 MG/ML IJ SOLN: 6.2500 mg | INTRAMUSCULAR | Status: DC | PRN

## 2018-12-17 MED ORDER — MENTHOL 3 MG MT LOZG: 1.0000 | LOZENGE | OROMUCOSAL | Status: DC | PRN

## 2018-12-17 MED ORDER — METHOCARBAMOL 500 MG IVPB - SIMPLE MED
500.0000 mg | Freq: Four times a day (QID) | INTRAVENOUS | Status: DC | PRN
  Administered 2018-12-17: 500 mg via INTRAVENOUS
  Filled 2018-12-17: qty 50

## 2018-12-17 MED ORDER — CEFAZOLIN SODIUM-DEXTROSE 2-4 GM/100ML-% IV SOLN
2.0000 g | Freq: Four times a day (QID) | INTRAVENOUS | Status: AC
  Administered 2018-12-17 (×2): 2 g via INTRAVENOUS
  Filled 2018-12-17 (×2): qty 100

## 2018-12-17 MED ORDER — BUPIVACAINE-EPINEPHRINE (PF) 0.25% -1:200000 IJ SOLN
INTRAMUSCULAR | Status: AC
  Filled 2018-12-17: qty 30

## 2018-12-17 MED ORDER — TRANEXAMIC ACID-NACL 1000-0.7 MG/100ML-% IV SOLN
1000.0000 mg | INTRAVENOUS | Status: AC
  Administered 2018-12-17: 1000 mg via INTRAVENOUS
  Filled 2018-12-17: qty 100

## 2018-12-17 SURGICAL SUPPLY — 42 items
BAG DECANTER FOR FLEXI CONT (MISCELLANEOUS) ×3 IMPLANT
BAG ZIPLOCK 12X15 (MISCELLANEOUS) IMPLANT
BLADE SAG 18X100X1.27 (BLADE) ×3 IMPLANT
BLADE SURG SZ10 CARB STEEL (BLADE) ×6 IMPLANT
CLOSURE WOUND 1/2 X4 (GAUZE/BANDAGES/DRESSINGS) ×1
COVER PERINEAL POST (MISCELLANEOUS) ×3 IMPLANT
COVER SURGICAL LIGHT HANDLE (MISCELLANEOUS) ×3 IMPLANT
COVER WAND RF STERILE (DRAPES) ×2 IMPLANT
CUP ACETBLR 54 OD PINNACLE (Hips) ×2 IMPLANT
DECANTER SPIKE VIAL GLASS SM (MISCELLANEOUS) ×3 IMPLANT
DRAPE STERI IOBAN 125X83 (DRAPES) ×3 IMPLANT
DRAPE U-SHAPE 47X51 STRL (DRAPES) ×6 IMPLANT
DRSG ADAPTIC 3X8 NADH LF (GAUZE/BANDAGES/DRESSINGS) ×3 IMPLANT
DRSG MEPILEX BORDER 4X4 (GAUZE/BANDAGES/DRESSINGS) ×3 IMPLANT
DRSG MEPILEX BORDER 4X8 (GAUZE/BANDAGES/DRESSINGS) ×3 IMPLANT
DURAPREP 26ML APPLICATOR (WOUND CARE) ×3 IMPLANT
ELECT REM PT RETURN 15FT ADLT (MISCELLANEOUS) ×3 IMPLANT
EVACUATOR 1/8 PVC DRAIN (DRAIN) ×3 IMPLANT
GLOVE BIO SURGEON STRL SZ7 (GLOVE) ×3 IMPLANT
GLOVE BIO SURGEON STRL SZ8 (GLOVE) ×3 IMPLANT
GLOVE BIOGEL PI IND STRL 7.0 (GLOVE) ×1 IMPLANT
GLOVE BIOGEL PI IND STRL 8 (GLOVE) ×1 IMPLANT
GLOVE BIOGEL PI INDICATOR 7.0 (GLOVE) ×2
GLOVE BIOGEL PI INDICATOR 8 (GLOVE) ×2
GOWN STRL REUS W/TWL LRG LVL3 (GOWN DISPOSABLE) ×3 IMPLANT
GOWN STRL REUS W/TWL XL LVL3 (GOWN DISPOSABLE) ×3 IMPLANT
HEAD CERAMIC DELTA 36 PLUS 1.5 (Hips) ×2 IMPLANT
HOLDER FOLEY CATH W/STRAP (MISCELLANEOUS) ×3 IMPLANT
LINER MARATHON NEUT +4X54X36 (Hips) ×2 IMPLANT
MANIFOLD NEPTUNE II (INSTRUMENTS) ×3 IMPLANT
PACK ANTERIOR HIP CUSTOM (KITS) ×3 IMPLANT
STEM FEMORAL SZ5 HIGH ACTIS (Nail) ×2 IMPLANT
STRIP CLOSURE SKIN 1/2X4 (GAUZE/BANDAGES/DRESSINGS) ×2 IMPLANT
SUT ETHIBOND NAB CT1 #1 30IN (SUTURE) ×3 IMPLANT
SUT MNCRL AB 4-0 PS2 18 (SUTURE) ×3 IMPLANT
SUT STRATAFIX 0 PDS 27 VIOLET (SUTURE) ×3
SUT VIC AB 2-0 CT1 27 (SUTURE) ×4
SUT VIC AB 2-0 CT1 TAPERPNT 27 (SUTURE) ×2 IMPLANT
SUTURE STRATFX 0 PDS 27 VIOLET (SUTURE) ×1 IMPLANT
SYR 50ML LL SCALE MARK (SYRINGE) IMPLANT
TRAY FOLEY MTR SLVR 16FR STAT (SET/KITS/TRAYS/PACK) ×3 IMPLANT
YANKAUER SUCT BULB TIP 10FT TU (MISCELLANEOUS) ×3 IMPLANT

## 2018-12-17 NOTE — Anesthesia Preprocedure Evaluation (Signed)
Anesthesia Evaluation  Patient identified by MRN, date of birth, ID band Patient awake    Reviewed: Allergy & Precautions, NPO status , Patient's Chart, lab work & pertinent test results  Airway Mallampati: II  TM Distance: >3 FB Neck ROM: Full    Dental no notable dental hx.    Pulmonary neg pulmonary ROS,    Pulmonary exam normal breath sounds clear to auscultation       Cardiovascular negative cardio ROS Normal cardiovascular exam Rhythm:Regular Rate:Normal     Neuro/Psych negative neurological ROS  negative psych ROS   GI/Hepatic negative GI ROS, Neg liver ROS, GERD  Medicated,  Endo/Other  negative endocrine ROS  Renal/GU negative Renal ROS  negative genitourinary   Musculoskeletal negative musculoskeletal ROS (+)   Abdominal   Peds negative pediatric ROS (+)  Hematology negative hematology ROS (+)   Anesthesia Other Findings   Reproductive/Obstetrics negative OB ROS                             Anesthesia Physical  Anesthesia Plan  ASA: II  Anesthesia Plan: Spinal   Post-op Pain Management:    Induction:   PONV Risk Score and Plan: 1 and Treatment may vary due to age or medical condition and Propofol infusion  Airway Management Planned: Simple Face Mask  Additional Equipment:   Intra-op Plan:   Post-operative Plan:   Informed Consent: I have reviewed the patients History and Physical, chart, labs and discussed the procedure including the risks, benefits and alternatives for the proposed anesthesia with the patient or authorized representative who has indicated his/her understanding and acceptance.       Plan Discussed with: CRNA and Anesthesiologist  Anesthesia Plan Comments: (  )        Anesthesia Quick Evaluation

## 2018-12-17 NOTE — Evaluation (Addendum)
Physical Therapy Evaluation Patient Details Name: Luke Becker MRN: [id number] DOB: [birth year] Today's Date: 12/17/2018   History of Present Illness  73 yo male s/p L DA-THA on 12/17/18. PMH includes OA, R THA 2019.  Clinical Impression  Pt presents with L hip pain, increased time and effort to perform mobility tasks, and decreased activity toelrance due to L hip pain. Pt to benefit from acute PT to address deficits. Pt ambulated 65 ft with RW with min guard assist for safety. Pt educated on ankle pumps (20/hour) to perform this afternoon/evening to increase circulation, to pt's tolerance and limited by pain. PT to progress mobility as tolerated, and will continue to follow acutely.        Follow Up Recommendations Follow surgeon's recommendation for DC plan and follow-up therapies;Supervision for mobility/OOB    Equipment Recommendations  None recommended by PT    Recommendations for Other Services       Precautions / Restrictions Precautions Precautions: Fall Restrictions Weight Bearing Restrictions: No Other Position/Activity Restrictions: WBAT       Mobility  Bed Mobility Overal bed mobility: Needs Assistance Bed Mobility: Supine to Sit     Supine to sit: Min guard;HOB elevated     General bed mobility comments: Min guard for safety. increased time and effort.   Transfers Overall transfer level: Needs assistance Equipment used: Rolling walker (2 wheeled) Transfers: Sit to/from Stand Sit to Stand: Min guard;From elevated surface         General transfer comment: Min guard for safety. Verbal cuing for hand placement.   Ambulation/Gait Ambulation/Gait assistance: Min guard Gait Distance (Feet): 65 Feet Assistive device: Rolling walker (2 wheeled) Gait Pattern/deviations: Step-to pattern;Decreased weight shift to left;Antalgic;Decreased stance time - left Gait velocity: decr    General Gait Details: Min guard for safety. Verbal cuing for sequencing,  turning.   Stairs            Wheelchair Mobility    Modified Rankin (Stroke Patients Only)       Balance Overall balance assessment: Mild deficits observed, not formally tested                                           Pertinent Vitals/Pain Pain Assessment: 0-10 Pain Score: 8  Pain Location: L hip  Pain Descriptors / Indicators: Aching Pain Intervention(s): Limited activity within patient's tolerance;Repositioned;Ice applied;Monitored during session    [name] expects to be discharged to:: Private residence Living Arrangements: Alone Available Help at Discharge: Family(Pt to stay with ex-wife in [place] while he is recovering; pt lives in [place]) Type of Home: [place] Access: Stairs to enter Entrance Stairs-Rails: Left Entrance Stairs-Number of Steps: [place]: Two level;Able to live on main level with bedroom/bathroom;1/2 bath on main level Home Equipment: Toilet riser;Walker - 2 wheels;Cane - single point;Hospital bed      Prior Function Level of Independence: Independent with assistive device(s)         Comments: using cane for ambulation PTA, pt reporting difficulty with stair navigation      Hand Dominance   Dominant Hand: Right    Extremity/Trunk Assessment   Upper Extremity Assessment Upper Extremity Assessment: Overall WFL for tasks assessed    Lower Extremity Assessment Lower Extremity Assessment: Overall WFL for tasks assessed;LLE deficits/detail LLE Deficits / Details: suspected post-surgical hip weakness    Cervical / Trunk Assessment Cervical /  Trunk Assessment: Normal  Communication   Communication: No difficulties  Cognition Arousal/Alertness: Awake/alert Behavior During Therapy: WFL for tasks assessed/performed Overall Cognitive Status: Within Functional Limits for tasks assessed                                        General Comments      Exercises      Assessment/Plan    PT Assessment Patient needs continued PT services  PT Problem List Decreased strength;Pain;Decreased activity tolerance;Decreased knowledge of use of DME;Decreased balance;Decreased mobility       PT Treatment Interventions DME instruction;Therapeutic activities;Gait training;Therapeutic exercise;Patient/family education;Stair training;Balance training;Functional mobility training    PT Goals (Current goals can be found in the Care Plan section)  Acute Rehab PT Goals Patient Stated Goal: go home  PT Goal Formulation: With patient Time For Goal Achievement: 12/24/18 Potential to Achieve Goals: Good    Frequency 7X/week   Barriers to discharge        Co-evaluation               AM-PAC PT "6 Clicks" Mobility  Outcome Measure Help needed turning from your back to your side while in a flat bed without using bedrails?: A Little Help needed moving from lying on your back to sitting on the side of a flat bed without using bedrails?: A Little Help needed moving to and from a bed to a chair (including a wheelchair)?: A Little Help needed standing up from a chair using your arms (e.g., wheelchair or bedside chair)?: A Little Help needed to walk in hospital room?: A Little Help needed climbing 3-5 steps with a railing? : A Little 6 Click Score: 18    End of Session Equipment Utilized During Treatment: Gait belt Activity Tolerance: Patient tolerated treatment well Patient left: in chair;with chair alarm set;with call bell/phone within reach;with SCD's reapplied(NT to plug SCDs back in, in room ) Nurse Communication: Mobility status PT Visit Diagnosis: Difficulty in walking, not elsewhere classified (R26.2);Other abnormalities of gait and mobility (R26.89)    Time: 1601-0932 PT Time Calculation (min) (ACUTE ONLY): 18 min   Charges:   PT Evaluation $PT Eval Low Complexity: 1 Low         Luke Becker, PT Acute Rehabilitation Services Pager [phone]   Office [phone]  Luke Becker 12/17/2018, 7:20 PM

## 2018-12-17 NOTE — Transfer of Care (Signed)
Immediate Anesthesia Transfer of Care Note  Patient: Luke Becker  Procedure(s) Performed: TOTAL HIP ARTHROPLASTY ANTERIOR APPROACH (Left Hip)  Patient Location: PACU  Anesthesia Type:General  Level of Consciousness: awake, alert  and oriented  Airway & Oxygen Therapy: Patient Spontanous Breathing and Patient connected to face mask oxygen  Post-op Assessment: Report given to RN and Post -op Vital signs reviewed and stable  Post vital signs: Reviewed and stable  Last Vitals:  Vitals Value Taken Time  BP 141/95 12/17/2018  1:55 PM  Temp    Pulse 61 12/17/2018  1:57 PM  Resp 15 12/17/2018  1:57 PM  SpO2 100 % 12/17/2018  1:57 PM  Vitals shown include unvalidated device data.  Last Pain:  Vitals:   12/17/18 1115  TempSrc:   PainSc: 7       Patients Stated Pain Goal: 5 (18/33/58 2518)  Complications: No apparent anesthesia complications

## 2018-12-17 NOTE — Op Note (Signed)
OPERATIVE REPORT- TOTAL HIP ARTHROPLASTY   PREOPERATIVE DIAGNOSIS: Osteoarthritis of the Left hip.   POSTOPERATIVE DIAGNOSIS: Osteoarthritis of the Left  hip.   PROCEDURE: Left total hip arthroplasty, anterior approach.   SURGEON: [name], MD   ASSISTANT: Theresa Duty, PA-C  ANESTHESIA:  General  ESTIMATED BLOOD LOSS:-225 mL    DRAINS: Hemovac x1.   COMPLICATIONS: None   CONDITION: PACU - hemodynamically stable.   BRIEF CLINICAL NOTE: Luke Becker is a 73 y.o. male who has advanced end-  stage arthritis of their Left  hip with progressively worsening pain and  dysfunction.The patient has failed nonoperative management and presents for  total hip arthroplasty.   PROCEDURE IN DETAIL: After successful administration of spinal  anesthetic, the traction boots for the [facility] bed were placed on both  feet and the patient was placed onto the [facility] bed, boots placed into the leg  holders. The Left hip was then isolated from the perineum with plastic  drapes and prepped and draped in the usual sterile fashion. ASIS and  greater trochanter were marked and a oblique incision was made, starting  at about 1 cm lateral and 2 cm distal to the ASIS and coursing towards  the anterior cortex of the femur. The skin was cut with a 10 blade  through subcutaneous tissue to the level of the fascia overlying the  tensor fascia lata muscle. The fascia was then incised in line with the  incision at the junction of the anterior third and posterior 2/3rd. The  muscle was teased off the fascia and then the interval between the TFL  and the rectus was developed. The Hohmann retractor was then placed at  the top of the femoral neck over the capsule. The vessels overlying the  capsule were cauterized and the fat on top of the capsule was removed.  A Hohmann retractor was then placed anterior underneath the rectus  femoris to give exposure to the entire anterior capsule. A T-shaped   capsulotomy was performed. The edges were tagged and the femoral head  was identified.       Osteophytes are removed off the superior acetabulum.  The femoral neck was then cut in situ with an oscillating saw. Traction  was then applied to the left lower extremity utilizing the [facility]  traction. The femoral head was then removed. Retractors were placed  around the acetabulum and then circumferential removal of the labrum was  performed. Osteophytes were also removed. Reaming starts at 49 mm to  medialize and  Increased in 2 mm increments to 53 mm. We reamed in  approximately 40 degrees of abduction, 20 degrees anteversion. A 54 mm  pinnacle acetabular shell was then impacted in anatomic position under  fluoroscopic guidance with excellent purchase. We did not need to place  any additional dome screws. A 36 mm neutral + 4 marathon liner was then  placed into the acetabular shell.       The femoral lift was then placed along the lateral aspect of the femur  just distal to the vastus ridge. The leg was  externally rotated and capsule  was stripped off the inferior aspect of the femoral neck down to the  level of the lesser trochanter, this was done with electrocautery. The femur was lifted after this was performed. The  leg was then placed in an extended and adducted position essentially delivering the femur. We also removed the capsule superiorly and the piriformis from the piriformis  fossa to gain excellent exposure of the  proximal femur. Rongeur was used to remove some cancellous bone to get  into the lateral portion of the proximal femur for placement of the  initial starter reamer. The starter broaches was placed  the starter broach  and was shown to go down the center of the canal. Broaching  with the Actis system was then performed starting at size 0  coursing  Up to size 5. A size 5 had excellent torsional and rotational  and axial stability. The trial high offset neck was then placed   with a 36 + 1.5 trial head. The hip was then reduced. We confirmed that  the stem was in the canal both on AP and lateral x-rays. It also has excellent sizing. The hip was reduced with outstanding stability through full extension and full external rotation.. AP pelvis was taken and the leg lengths were measured and found to be equal. Hip was then dislocated again and the femoral head and neck removed. The  femoral broach was removed. Size 5 Actis stem with a high offset  neck was then impacted into the femur following native anteversion. Has  excellent purchase in the canal. Excellent torsional and rotational and  axial stability. It is confirmed to be in the canal on AP and lateral  fluoroscopic views. The 36 + 1.5 ceramic head was placed and the hip  reduced with outstanding stability. Again AP pelvis was taken and it  confirmed that the leg lengths were equal. The wound was then copiously  irrigated with saline solution and the capsule reattached and repaired  with Ethibond suture. 30 ml of .25% Bupivicaine was  injected into the capsule and into the edge of the tensor fascia lata as well as subcutaneous tissue. The fascia overlying the tensor fascia lata was then closed with a running #1 V-Loc. Subcu was closed with interrupted 2-0 Vicryl and subcuticular running 4-0 Monocryl. Incision was cleaned  and dried. Steri-Strips and a bulky sterile dressing applied. Hemovac  drain was hooked to suction and then the patient was awakened and transported to  recovery in stable condition.        Please note that a surgical assistant was a medical necessity for this procedure to perform it in a safe and expeditious manner. Assistant was necessary to provide appropriate retraction of vital neurovascular structures and to prevent femoral fracture and allow for anatomic placement of the prosthesis.  [name], M.D.

## 2018-12-17 NOTE — Interval H&P Note (Signed)
History and Physical Interval Note:  12/17/2018 11:09 AM  Luke Becker  has presented today for surgery, with the diagnosis of left hip osteoarthritis  The various methods of treatment have been discussed with the patient and family. After consideration of risks, benefits and other options for treatment, the patient has consented to  Procedure(s) with comments: [place] (Left) - 141min as a surgical intervention .  The patient's history has been reviewed, patient examined, no change in status, stable for surgery.  I have reviewed the patient's chart and labs.  Questions were answered to the patient's satisfaction.     Pilar Plate Terius Jacuinde

## 2018-12-17 NOTE — Plan of Care (Signed)

## 2018-12-17 NOTE — Anesthesia Procedure Notes (Signed)
Spinal  Patient location during procedure: OR Start time: 12/17/2018 12:13 PM End time: 12/17/2018 12:16 PM Staffing Resident/CRNA: Niel Hummer, CRNA Performed: resident/CRNA  Preanesthetic Checklist Completed: patient identified, surgical consent, pre-op evaluation, IV checked, risks and benefits discussed and monitors and equipment checked Spinal Block Patient position: sitting Prep: DuraPrep Patient monitoring: continuous pulse ox, heart rate and blood pressure Approach: midline Location: L2-3 Injection technique: single-shot Needle Needle type: Pencan  Needle gauge: 24 G Assessment Sensory level: T6

## 2018-12-17 NOTE — Anesthesia Procedure Notes (Signed)
Procedure Name: MAC Date/Time: 12/17/2018 12:10 PM Performed by: Niel Hummer, CRNA Pre-anesthesia Checklist: Patient being monitored, Suction available, Emergency Drugs available and Patient identified Patient Re-evaluated:Patient Re-evaluated prior to induction Oxygen Delivery Method: Simple face mask Preoxygenation: Pre-oxygenation with 100% oxygen

## 2018-12-17 NOTE — Anesthesia Postprocedure Evaluation (Signed)
Anesthesia Post Note  Patient: Luke Becker  Procedure(s) Performed: TOTAL HIP ARTHROPLASTY ANTERIOR APPROACH (Left Hip)     Patient location during evaluation: PACU Anesthesia Type: Spinal and General Level of consciousness: awake and alert Pain management: pain level controlled Vital Signs Assessment: post-procedure vital signs reviewed and stable Respiratory status: spontaneous breathing and respiratory function stable Cardiovascular status: blood pressure returned to baseline and stable Postop Assessment: no headache, no backache, spinal receding and no apparent nausea or vomiting Anesthetic complications: no Comments: Failed spinal converted to general.    Last Vitals:  Vitals:   12/17/18 1530 12/17/18 1601  BP: (!) 147/86 (!) 158/93  Pulse: 62 63  Resp: 11 18  Temp:  36.7 C  SpO2: 100% 100%    Last Pain:  Vitals:   12/17/18 1601  TempSrc: Oral  PainSc:                  [name]

## 2018-12-17 NOTE — Anesthesia Procedure Notes (Signed)
Procedure Name: LMA Insertion Date/Time: 12/17/2018 12:43 PM Performed by: Niel Hummer, CRNA Pre-anesthesia Checklist: Patient identified, Emergency Drugs available, Suction available and Patient being monitored Patient Re-evaluated:Patient Re-evaluated prior to induction Oxygen Delivery Method: Circle system utilized Preoxygenation: Pre-oxygenation with 100% oxygen Induction Type: IV induction LMA: LMA inserted LMA Size: 4.0 Dental Injury: Teeth and Oropharynx as per pre-operative assessment

## 2018-12-18 LAB — BASIC METABOLIC PANEL
Anion gap: 8 (ref 5–15)
BUN: 15 mg/dL (ref 8–23)
CO2: 24 mmol/L (ref 22–32)
Calcium: 8.4 mg/dL — ABNORMAL LOW (ref 8.9–10.3)
Chloride: 105 mmol/L (ref 98–111)
Creatinine, Ser: 1.22 mg/dL (ref 0.61–1.24)
GFR calc Af Amer: >60 mL/min (ref >60)
GFR calc non Af Amer: 60 mL/min — ABNORMAL LOW (ref >60)
GLUCOSE: 165 mg/dL — AB (ref 70–99)
Potassium: 4 mmol/L (ref 3.5–5.1)
Sodium: 137 mmol/L (ref 135–145)

## 2018-12-18 LAB — CBC
HCT: 33 % — ABNORMAL LOW (ref 39.0–52.0)
Hemoglobin: 11 g/dL — ABNORMAL LOW (ref 13.0–17.0)
MCH: 31.2 pg (ref 26.0–34.0)
MCHC: 33.3 g/dL (ref 30.0–36.0)
MCV: 93.5 fL (ref 80.0–100.0)
Platelets: 236 10*3/uL (ref 150–400)
RBC: 3.53 MIL/uL — ABNORMAL LOW (ref 4.22–5.81)
RDW: 11.9 % (ref 11.5–15.5)
WBC: 12.2 10*3/uL — ABNORMAL HIGH (ref 4.0–10.5)
nRBC: 0 % (ref 0.0–0.2)

## 2018-12-18 MED ORDER — ASPIRIN 325 MG PO TBEC: 325.0000 mg | DELAYED_RELEASE_TABLET | Freq: Two times a day (BID) | ORAL | 0 refills | Status: AC

## 2018-12-18 MED ORDER — METHOCARBAMOL 500 MG PO TABS: 500.0000 mg | ORAL_TABLET | Freq: Four times a day (QID) | ORAL | 0 refills | Status: DC | PRN

## 2018-12-18 MED ORDER — HYDROCODONE-ACETAMINOPHEN 5-325 MG PO TABS: 1.0000 | ORAL_TABLET | Freq: Four times a day (QID) | ORAL | 0 refills | Status: DC | PRN

## 2018-12-18 MED ORDER — TRAMADOL HCL 50 MG PO TABS: 50.0000 mg | ORAL_TABLET | Freq: Four times a day (QID) | ORAL | 0 refills | Status: DC | PRN

## 2018-12-18 NOTE — Progress Notes (Signed)
Discharge paperwork discussed with pt at the bedside.  He demonstrated understanding.  Pt was escorted by wheelchair in stable condition to main lobby.   

## 2018-12-18 NOTE — Progress Notes (Signed)
   Subjective: 1 Day Post-Op Procedure(s) (LRB): TOTAL HIP ARTHROPLASTY ANTERIOR APPROACH (Left) Patient reports pain as mild.   Patient seen in rounds by Dr. Wynelle Link. Patient is well, and has had no acute complaints or problems. States he is ready to go home. Foley catheter removed this AM. Denies chest pain or SOB. No issues overnight.  We will start therapy today.   Objective: Vital signs in last 24 hours: Temp:  [97.7 F (36.5 C)-98.9 F (37.2 C)] 98.6 F (37 C) (01/16 0558) Pulse Rate:  [59-90] 64 (01/16 0558) Resp:  [8-18] 16 (01/16 0558) BP: (107-158)/(65-96) 107/74 (01/16 0558) SpO2:  [95 %-100 %] 99 % (01/16 0558) Weight:  [77.6 kg] 77.6 kg (01/15 1115)  Intake/Output from previous day:  Intake/Output Summary (Last 24 hours) at 12/18/2018 0754 Last data filed at 12/18/2018 0615 Gross per 24 hour  Intake 3981.07 ml  Output 5850 ml  Net -1868.93 ml   Labs: Recent Labs    12/18/18 0335  HGB 11.0*   Recent Labs    12/18/18 0335  WBC 12.2*  RBC 3.53*  HCT 33.0*  PLT 236   Recent Labs    12/18/18 0335  NA 137  K 4.0  CL 105  CO2 24  BUN 15  CREATININE 1.22  GLUCOSE 165*  CALCIUM 8.4*   Exam: General - Patient is Alert and Oriented Extremity - Neurologically intact Neurovascular intact Sensation intact distally Dorsiflexion/Plantar flexion intact Dressing - dressing C/D/I Motor Function - intact, moving foot and toes well on exam.   Past Medical History:  Diagnosis Date  . BPH (benign prostatic hyperplasia)   . Chronic hip pain   . Diverticulosis   . GERD (gastroesophageal reflux disease)   . History of ankle fracture   . OA (osteoarthritis)   . Sinus bradycardia     Assessment/Plan: 1 Day Post-Op Procedure(s) (LRB): TOTAL HIP ARTHROPLASTY ANTERIOR APPROACH (Left) Active Problems:   OA (osteoarthritis) of hip  Estimated body mass index is 25.27 kg/m as calculated from the following:   Height as of this encounter: 5\' 9"  (1.753 m).  Weight as of this encounter: 77.6 kg. Advance diet Up with therapy D/C IV fluids  DVT Prophylaxis - Aspirin Weight bearing as tolerated. D/C O2 and pulse ox and try on room air. Hemovac pulled without difficulty, will continue therapy.  Plan is to go Home after hospital stay. Plan for discharge today with HEP once meeting goals with therapy. Follow-up in the office in 2 weeks with Dr. Wynelle Link.  Theresa Duty, PA-C Orthopedic Surgery 12/18/2018, 7:54 AM

## 2018-12-18 NOTE — Progress Notes (Signed)
Physical Therapy Treatment Patient Details Name: Luke Becker MRN: [id number] DOB: [birth year] Today's Date: 12/18/2018    History of Present Illness 73 yo male s/p L DA-THA on 12/17/18. PMH includes OA, R THA 2019.    PT Comments    Progressing with mobility. Reviewed/practiced exercises, gait training, and stair training. Issued HEP for pt to perform at least 2x/day. All education completed-made RN aware.    Follow Up Recommendations  Follow surgeon's recommendation for DC plan and follow-up therapies     Equipment Recommendations  None recommended by PT    Recommendations for Other Services       Precautions / Restrictions Precautions Precautions: Fall Restrictions Weight Bearing Restrictions: No Other Position/Activity Restrictions: WBAT     Mobility  Bed Mobility Overal bed mobility: Needs Assistance Bed Mobility: Supine to Sit     Supine to sit: Min guard     General bed mobility comments: Min guard for safety. increased time and effort. Pt used UE to assist L LE off bed.   Transfers Overall transfer level: Needs assistance Equipment used: Rolling walker (2 wheeled) Transfers: Sit to/from Stand Sit to Stand: Min guard         General transfer comment: Min guard for safety. Verbal cuing for hand placement.   Ambulation/Gait Ambulation/Gait assistance: Min guard Gait Distance (Feet): 175 Feet Assistive device: Rolling walker (2 wheeled) Gait Pattern/deviations: Step-to pattern;Step-through pattern;Decreased stride length     General Gait Details: Min guard for safety.    Stairs Stairs: Yes Stairs assistance: Min guard assist Stair Management: Sideways;Step to pattern;One rail Left Number of Stairs: 2 General stair comments: up and over portable steps using 2 hands on 1 (L) handrail. VCS safety, technique, sequence   Wheelchair Mobility    Modified Rankin (Stroke Patients Only)       Balance Overall balance assessment: Mild  deficits observed, not formally tested                                          Cognition Arousal/Alertness: Awake/alert Behavior During Therapy: WFL for tasks assessed/performed Overall Cognitive Status: Within Functional Limits for tasks assessed                                        Exercises Total Joint Exercises Ankle Circles/Pumps: AROM;Both;10 reps;Supine Quad Sets: AROM;Both;10 reps;Supine Heel Slides: AAROM;Left;10 reps;Supine Hip ABduction/ADduction: AAROM;Left;10 reps;Supine Long Arc Quad: AROM;Left;10 reps;Seated Knee Flexion: AROM;Left;10 reps;Standing Marching in Standing: AROM;Both;10 reps;Standing General Exercises - Lower Extremity Heel Raises: AROM;Both;10 reps;Standing    General Comments        Pertinent Vitals/Pain Pain Assessment: 0-10 Pain Score: 7  Pain Location: L hip  Pain Descriptors / Indicators: Sore;Tightness;Discomfort Pain Intervention(s): Monitored during session;Repositioned;Ice applied    Home Living                      Prior Function            PT Goals (current goals can now be found in the care plan section) Progress towards PT goals: Progressing toward goals    Frequency    7X/week      PT Plan Current plan remains appropriate    Co-evaluation              AM-PAC  PT "6 Clicks" Mobility   Outcome Measure  Help needed turning from your back to your side while in a flat bed without using bedrails?: None Help needed moving from lying on your back to sitting on the side of a flat bed without using bedrails?: A Little Help needed moving to and from a bed to a chair (including a wheelchair)?: A Little Help needed standing up from a chair using your arms (e.g., wheelchair or bedside chair)?: A Little Help needed to walk in hospital room?: A Little Help needed climbing 3-5 steps with a railing? : A Little 6 Click Score: 19    End of Session Equipment Utilized During  Treatment: Gait belt Activity Tolerance: Patient tolerated treatment well Patient left: in chair;with call bell/phone within reach   PT Visit Diagnosis: Difficulty in walking, not elsewhere classified (R26.2);Other abnormalities of gait and mobility (R26.89)     Time: 3614-4315 PT Time Calculation (min) (ACUTE ONLY): 37 min  Charges:  $Gait Training: 8-22 mins $Therapeutic Exercise: 8-22 mins                        Weston Anna, PT Acute Rehabilitation Services Pager: [phone] Office: [phone]

## 2018-12-18 NOTE — Plan of Care (Signed)
Pt alert and oriented, resting this am. Pain controlled with PO pain meds.  Plan to d/c home per MD order.

## 2018-12-19 ENCOUNTER — Encounter (HOSPITAL_COMMUNITY): Payer: Self-pay | Admitting: Orthopedic Surgery

## 2018-12-22 NOTE — Discharge Summary (Signed)
Physician Discharge Summary   Patient ID: Luke Becker MRN: [id number] DOB/AGE: [birth year] 73 y.o.  Admit date: 12/17/2018 Discharge date: 12/18/2018  Primary Diagnosis: Osteoarthritis of the left hip   Admission Diagnoses:  Past Medical History:  Diagnosis Date  . BPH (benign prostatic hyperplasia)   . Chronic hip pain   . Diverticulosis   . GERD (gastroesophageal reflux disease)   . History of ankle fracture   . OA (osteoarthritis)   . Sinus bradycardia    Discharge Diagnoses:   Active Problems:   OA (osteoarthritis) of hip  Estimated body mass index is 25.27 kg/m as calculated from the following:   Height as of this encounter: 5\' 9"  (1.753 m).   Weight as of this encounter: 77.6 kg.  Procedure:  Procedure(s) (LRB): TOTAL HIP ARTHROPLASTY ANTERIOR APPROACH (Left)   Consults: None  HPI: Luke Becker is a 73 y.o. male who has advanced end-  stage arthritis of their Left  hip with progressively worsening pain and  dysfunction.The patient has failed nonoperative management and presents for  total hip arthroplasty.   Laboratory Data: Admission on 12/17/2018, Discharged on 12/18/2018  Component Date Value Ref Range Status  . WBC 12/18/2018 12.2* 4.0 - 10.5 K/uL Final  . RBC 12/18/2018 3.53* 4.22 - 5.81 MIL/uL Final  . Hemoglobin 12/18/2018 11.0* 13.0 - 17.0 g/dL Final  . HCT 12/18/2018 33.0* 39.0 - 52.0 % Final  . MCV 12/18/2018 93.5  80.0 - 100.0 fL Final  . MCH 12/18/2018 31.2  26.0 - 34.0 pg Final  . MCHC 12/18/2018 33.3  30.0 - 36.0 g/dL Final  . RDW 12/18/2018 11.9  11.5 - 15.5 % Final  . Platelets 12/18/2018 236  150 - 400 K/uL Final  . nRBC 12/18/2018 0.0  0.0 - 0.2 % Final   Performed at [facility], [place] [address] [place], [place] [address]  . Sodium 12/18/2018 137  135 - 145 mmol/L Final  . Potassium 12/18/2018 4.0  3.5 - 5.1 mmol/L Final  . Chloride 12/18/2018 105  98 - 111 mmol/L Final  . CO2 12/18/2018 24  22 - 32  mmol/L Final  . Glucose, Bld 12/18/2018 165* 70 - 99 mg/dL Final  . BUN 12/18/2018 15  8 - 23 mg/dL Final  . Creatinine, Ser 12/18/2018 1.22  0.61 - 1.24 mg/dL Final  . Calcium 12/18/2018 8.4* 8.9 - 10.3 mg/dL Final  . GFR calc non Af Amer 12/18/2018 60* >60 mL/min Final  . GFR calc Af Amer 12/18/2018 >60  >60 mL/min Final  . Anion gap 12/18/2018 8  5 - 15 Final   Performed at [facility], [place] [address], [place], [place] [address]  Hospital Outpatient Visit on 12/12/2018  Component Date Value Ref Range Status  . aPTT 12/12/2018 36  24 - 36 seconds Final   Performed at [facility], [place] [address] [place], [place] [address]  . WBC 12/12/2018 6.2  4.0 - 10.5 K/uL Final  . RBC 12/12/2018 4.51  4.22 - 5.81 MIL/uL Final  . Hemoglobin 12/12/2018 13.9  13.0 - 17.0 g/dL Final  . HCT 12/12/2018 41.8  39.0 - 52.0 % Final  . MCV 12/12/2018 92.7  80.0 - 100.0 fL Final  . MCH 12/12/2018 30.8  26.0 - 34.0 pg Final  . MCHC 12/12/2018 33.3  30.0 - 36.0 g/dL Final  . RDW 12/12/2018 12.0  11.5 - 15.5 % Final  . Platelets 12/12/2018 294  150 - 400 K/uL  Final  . nRBC 12/12/2018 0.0  0.0 - 0.2 % Final  . Neutrophils Relative % 12/12/2018 65  % Final  . Neutro Abs 12/12/2018 4.1  1.7 - 7.7 K/uL Final  . Lymphocytes Relative 12/12/2018 21  % Final  . Lymphs Abs 12/12/2018 1.3  0.7 - 4.0 K/uL Final  . Monocytes Relative 12/12/2018 12  % Final  . Monocytes Absolute 12/12/2018 0.7  0.1 - 1.0 K/uL Final  . Eosinophils Relative 12/12/2018 1  % Final  . Eosinophils Absolute 12/12/2018 0.0  0.0 - 0.5 K/uL Final  . Basophils Relative 12/12/2018 1  % Final  . Basophils Absolute 12/12/2018 0.1  0.0 - 0.1 K/uL Final  . Immature Granulocytes 12/12/2018 0  % Final  . Abs Immature Granulocytes 12/12/2018 0.02  0.00 - 0.07 K/uL Final   Performed at [facility], [place] [address] [place], [place] [address]  . Sodium 12/12/2018 140  135 - 145 mmol/L Final  .  Potassium 12/12/2018 4.1  3.5 - 5.1 mmol/L Final  . Chloride 12/12/2018 102  98 - 111 mmol/L Final  . CO2 12/12/2018 28  22 - 32 mmol/L Final  . Glucose, Bld 12/12/2018 100* 70 - 99 mg/dL Final  . BUN 12/12/2018 17  8 - 23 mg/dL Final  . Creatinine, Ser 12/12/2018 1.30* 0.61 - 1.24 mg/dL Final  . Calcium 12/12/2018 9.1  8.9 - 10.3 mg/dL Final  . Total Protein 12/12/2018 7.3  6.5 - 8.1 g/dL Final  . Albumin 12/12/2018 4.4  3.5 - 5.0 g/dL Final  . AST 12/12/2018 30  15 - 41 U/L Final  . ALT 12/12/2018 44  0 - 44 U/L Final  . Alkaline Phosphatase 12/12/2018 66  38 - 126 U/L Final  . Total Bilirubin 12/12/2018 1.4* 0.3 - 1.2 mg/dL Final  . GFR calc non Af Amer 12/12/2018 55* >60 mL/min Final  . GFR calc Af Amer 12/12/2018 >60  >60 mL/min Final  . Anion gap 12/12/2018 10  5 - 15 Final   Performed at [facility], [place] [address], [place], [place] [address]  . Prothrombin Time 12/12/2018 13.2  11.4 - 15.2 seconds Final  . INR 12/12/2018 1.01   Final   Performed at [facility], [place] [address], [place], [place] [address]  . ABO/RH(D) 12/12/2018 O POS   Final  . Antibody Screen 12/12/2018 NEG   Final  . Sample Expiration 12/12/2018 12/20/2018   Final  . Extend sample reason 12/12/2018    Final                   Value:NO TRANSFUSIONS OR PREGNANCY IN THE PAST 3 MONTHS Performed at [facility], [place] [address] [place], [place] [address]   . Color, Urine 12/12/2018 YELLOW  YELLOW Final  . APPearance 12/12/2018 HAZY* CLEAR Final  . Specific Gravity, Urine 12/12/2018 1.010  1.005 - 1.030 Final  . pH 12/12/2018 7.0  5.0 - 8.0 Final  . Glucose, UA 12/12/2018 NEGATIVE  NEGATIVE mg/dL Final  . Hgb urine dipstick 12/12/2018 SMALL* NEGATIVE Final  . Bilirubin Urine 12/12/2018 NEGATIVE  NEGATIVE Final  . Ketones, ur 12/12/2018 NEGATIVE  NEGATIVE mg/dL Final  . Protein, ur 12/12/2018 NEGATIVE  NEGATIVE mg/dL Final  . Nitrite 12/12/2018 NEGATIVE   NEGATIVE Final  . Leukocytes, UA 12/12/2018 LARGE* NEGATIVE Final  . RBC / HPF 12/12/2018 0-5  0 - 5 RBC/hpf Final  . WBC, UA 12/12/2018 >50* 0 - 5  WBC/hpf Final  . Bacteria, UA 12/12/2018 MANY* NONE SEEN Final  . Squamous Epithelial / LPF 12/12/2018 0-5  0 - 5 Final  . Mucus 12/12/2018 PRESENT   Final   Performed at [place] [address] [place], [place] [address]  . MRSA, PCR 12/12/2018 NEGATIVE  NEGATIVE Final  . Staphylococcus aureus 12/12/2018 POSITIVE* NEGATIVE Final   Comment: (NOTE) The Xpert SA Assay (FDA approved for NASAL specimens in patients 62 years of age and older), is one component of a comprehensive surveillance program. It is not intended to diagnose infection nor to guide or monitor treatment. Performed at [facility], [place] [address] [place], [place] [address]      X-Rays:Dg Pelvis Portable  Result Date: 12/17/2018 CLINICAL DATA:  Status post left hip replacement EXAM: PORTABLE PELVIS 1-2 VIEWS COMPARISON:  None. 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 Surgical drains are noted on the left. No soft tissue or acute bony abnormality is noted. IMPRESSION: Status post left hip replacement Electronically Signed   By: Inez Catalina M.D.   On: 12/17/2018 14:30   Dg C-arm 1-60 Min-no Report  Result Date: 12/17/2018 Fluoroscopy was utilized by the requesting physician.  No radiographic interpretation.   Dg Hip Operative Unilat W Or W/o Pelvis Left  Result Date: 12/17/2018 CLINICAL DATA:  ANTERIOR approach LEFT total hip arthroplasty. EXAM: OPERATIVE LEFT HIP (WITH PELVIS IF PERFORMED) 1 VIEW TECHNIQUE: Fluoroscopic spot image(s) were submitted for interpretation post-operatively. COMPARISON:  Pelvis x-ray 12/23/2017. FINDINGS: Anatomic alignment in the AP projection of the LEFT hip prosthesis post ANTERIOR approach LEFT total hip arthroplasty. Similar anatomic alignment of the previous RIGHT total hip arthroplasty.  IMPRESSION: Anatomic alignment of the LEFT hip prosthesis post total hip arthroplasty. Electronically Signed   By: Evangeline Dakin M.D.   On: 12/17/2018 13:55    EKG: Orders placed or performed during the hospital encounter of 10/29/16  . EKG 12-Lead  . EKG 12-Lead  . ED EKG within 10 minutes  . ED EKG within 10 minutes     Hospital Course: Luke Becker is a 73 y.o. who was admitted to [facility]. They were brought to the operating room on 12/17/2018 and underwent Procedure(s): [place].  Patient tolerated the procedure well and was later transferred to the recovery room and then to the orthopaedic floor for postoperative care. They were given PO and IV analgesics for pain control following their surgery. They were given 24 hours of postoperative antibiotics of  Anti-infectives (From admission, onward)   Start     Dose/Rate Route Frequency Ordered Stop   12/17/18 1800  ceFAZolin (ANCEF) IVPB 2g/100 mL premix     2 g 200 mL/hr over 30 Minutes Intravenous Every 6 hours 12/17/18 1528 12/18/18 0012   12/17/18 1100  ceFAZolin (ANCEF) IVPB 2g/100 mL premix     2 g 200 mL/hr over 30 Minutes Intravenous On call to O.R. 12/17/18 1045 12/17/18 1247     and started on DVT prophylaxis in the form of Aspirin.   PT and OT were ordered for total joint protocol. Discharge planning consulted to help with postop disposition and equipment needs.  Patient had a good night on the evening of surgery. They started to get up OOB with therapy on POD #0. Continued to work with therapy on POD #1. Pt was seen during rounds and was ready to go home pending progress with therapy. Hemovac drain was pulled without difficulty. He worked  with therapy on POD #1 and was meeting his goals. Pt was discharged to home later that day in stable condition.  Diet: Regular diet Activity: WBAT Follow-up: in 2 weeks Disposition: Home Discharged Condition: good   Discharge  Instructions    Call MD / Call 911   Complete by:  As directed    If you experience chest pain or shortness of breath, CALL 911 and be transported to the hospital emergency room.  If you develope a fever above 101 F, pus (white drainage) or increased drainage or redness at the wound, or calf pain, call your surgeon's office.   Change dressing   Complete by:  As directed    You may change your dressing on Friday, then change the dressing daily with sterile 4 x 4 inch gauze dressing and paper tape.   Constipation Prevention   Complete by:  As directed    Drink plenty of fluids.  Prune juice may be helpful.  You may use a stool softener, such as Colace (over the counter) 100 mg twice a day.  Use MiraLax (over the counter) for constipation as needed.   Diet - low sodium heart healthy   Complete by:  As directed    Discharge instructions   Complete by:  As directed    Dr. [name] Total Joint Specialist [facility] [address], [place], [place] [address] [phone]  ANTERIOR APPROACH TOTAL HIP REPLACEMENT POSTOPERATIVE DIRECTIONS   Hip Rehabilitation, Guidelines Following Surgery  The results of a hip operation are greatly improved after range of motion and muscle strengthening exercises. Follow all safety measures which are given to protect your hip. If any of these exercises cause increased pain or swelling in your joint, decrease the amount until you are comfortable again. Then slowly increase the exercises. Call your caregiver if you have problems or questions.   HOME CARE INSTRUCTIONS  Remove items at home which could result in a fall. This includes throw rugs or furniture in walking pathways.  ICE to the affected hip every three hours for 30 minutes at a time and then as needed for pain and swelling.  Continue to use ice on the hip for pain and swelling from surgery. You may notice swelling that will progress down to the foot and ankle.  This is normal after  surgery.  Elevate the leg when you are not up walking on it.   Continue to use the breathing machine which will help keep your temperature down.  It is common for your temperature to cycle up and down following surgery, especially at night when you are not up moving around and exerting yourself.  The breathing machine keeps your lungs expanded and your temperature down.  DIET You may resume your previous home diet once your are discharged from the hospital.  DRESSING / WOUND CARE / SHOWERING You may shower 3 days after surgery, but keep the wounds dry during showering.  You may use an occlusive plastic wrap (Press'n Seal for example), NO SOAKING/SUBMERGING IN THE BATHTUB.  If the bandage gets wet, change with a clean dry gauze.  If the incision gets wet, pat the wound dry with a clean towel. You may start showering once you are discharged home but do not submerge the incision under water. Just pat the incision dry and apply a dry gauze dressing on daily. Change the surgical dressing daily and reapply a dry dressing each time.  ACTIVITY Walk with your walker as instructed. Use  walker as long as suggested by your caregivers. Avoid periods of inactivity such as sitting longer than an hour when not asleep. This helps prevent blood clots.  You may resume a sexual relationship in one month or when given the OK by your doctor.  You may return to work once you are cleared by your doctor.  Do not drive a car for 6 weeks or until released by you surgeon.  Do not drive while taking narcotics.  WEIGHT BEARING Weight bearing as tolerated with assist device (walker, cane, etc) as directed, use it as long as suggested by your surgeon or therapist, typically at least 4-6 weeks.  POSTOPERATIVE CONSTIPATION PROTOCOL Constipation - defined medically as fewer than three stools per week and severe constipation as less than one stool per week.  One of the most common issues patients have following surgery is  constipation.  Even if you have a regular bowel pattern at home, your normal regimen is likely to be disrupted due to multiple reasons following surgery.  Combination of anesthesia, postoperative narcotics, change in appetite and fluid intake all can affect your bowels.  In order to avoid complications following surgery, here are some recommendations in order to help you during your recovery period.  Colace (docusate) - Pick up an over-the-counter form of Colace or another stool softener and take twice a day as long as you are requiring postoperative pain medications.  Take with a full glass of water daily.  If you experience loose stools or diarrhea, hold the colace until you stool forms back up.  If your symptoms do not get better within 1 week or if they get worse, check with your doctor.  Dulcolax (bisacodyl) - Pick up over-the-counter and take as directed by the product packaging as needed to assist with the movement of your bowels.  Take with a full glass of water.  Use this product as needed if not relieved by Colace only.   MiraLax (polyethylene glycol) - Pick up over-the-counter to have on hand.  MiraLax is a solution that will increase the amount of water in your bowels to assist with bowel movements.  Take as directed and can mix with a glass of water, juice, soda, coffee, or tea.  Take if you go more than two days without a movement. Do not use MiraLax more than once per day. Call your doctor if you are still constipated or irregular after using this medication for 7 days in a row.  If you continue to have problems with postoperative constipation, please contact the office for further assistance and recommendations.  If you experience "the worst abdominal pain ever" or develop nausea or vomiting, please contact the office immediatly for further recommendations for treatment.  ITCHING  If you experience itching with your medications, try taking only a single pain pill, or even half a pain pill  at a time.  You can also use Benadryl over the counter for itching or also to help with sleep.   TED HOSE STOCKINGS Wear the elastic stockings on both legs for three weeks following surgery during the day but you may remove then at night for sleeping.  MEDICATIONS See your medication summary on the "After Visit Summary" that the nursing staff will review with you prior to discharge.  You may have some home medications which will be placed on hold until you complete the course of blood thinner medication.  It is important for you to complete the blood thinner medication as prescribed by your  Psychologist, sport and exercise.  Continue your approved medications as instructed at time of discharge.  PRECAUTIONS If you experience chest pain or shortness of breath - call 911 immediately for transfer to the hospital emergency department.  If you develop a fever greater that 101 F, purulent drainage from wound, increased redness or drainage from wound, foul odor from the wound/dressing, or calf pain - CONTACT YOUR SURGEON.                                                   FOLLOW-UP APPOINTMENTS Make sure you keep all of your appointments after your operation with your surgeon and caregivers. You should call the office at the above phone number and make an appointment for approximately two weeks after the date of your surgery or on the date instructed by your surgeon outlined in the "After Visit Summary".  RANGE OF MOTION AND STRENGTHENING EXERCISES  These exercises are designed to help you keep full movement of your hip joint. Follow your caregiver's or physical therapist's instructions. Perform all exercises about fifteen times, three times per day or as directed. Exercise both hips, even if you have had only one joint replacement. These exercises can be done on a training (exercise) mat, on the floor, on a table or on a bed. Use whatever works the best and is most comfortable for you. Use music or television while you are  exercising so that the exercises are a pleasant break in your day. This will make your life better with the exercises acting as a break in routine you can look forward to.  Lying on your back, slowly slide your foot toward your buttocks, raising your knee up off the floor. Then slowly slide your foot back down until your leg is straight again.  Lying on your back spread your legs as far apart as you can without causing discomfort.  Lying on your side, raise your upper leg and foot straight up from the floor as far as is comfortable. Slowly lower the leg and repeat.  Lying on your back, tighten up the muscle in the front of your thigh (quadriceps muscles). You can do this by keeping your leg straight and trying to raise your heel off the floor. This helps strengthen the largest muscle supporting your knee.  Lying on your back, tighten up the muscles of your buttocks both with the legs straight and with the knee bent at a comfortable angle while keeping your heel on the floor.   IF YOU ARE TRANSFERRED TO A SKILLED REHAB FACILITY If the patient is transferred to a skilled rehab facility following release from the hospital, a list of the current medications will be sent to the facility for the patient to continue.  When discharged from the skilled rehab facility, please have the facility set up the patient's [place] prior to being released. Also, the skilled facility will be responsible for providing the patient with their medications at time of release from the facility to include their pain medication, the muscle relaxants, and their blood thinner medication. If the patient is still at the rehab facility at time of the two week follow up appointment, the skilled rehab facility will also need to assist the patient in arranging follow up appointment in our office and any transportation needs.  MAKE SURE YOU:  Understand these instructions.  Get help right away if you are not doing well  or get worse.    Pick up stool softner and laxative for home use following surgery while on pain medications. Do not submerge incision under water. Please use good hand washing techniques while changing dressing each day. May shower starting three days after surgery. Please use a clean towel to pat the incision dry following showers. Continue to use ice for pain and swelling after surgery. Do not use any lotions or creams on the incision until instructed by your surgeon.   Do not sit on low chairs, stoools or toilet seats, as it may be difficult to get up from low surfaces   Complete by:  As directed    Driving restrictions   Complete by:  As directed    No driving for two weeks   TED hose   Complete by:  As directed    Use stockings (TED hose) for three weeks on both leg(s).  You may remove them at night for sleeping.   Weight bearing as tolerated   Complete by:  As directed      Allergies as of 12/18/2018      Reactions   Azithromycin Other (See Comments)   Upset stomach      Medication List    TAKE these medications   acetaminophen 500 MG tablet Commonly known as:  TYLENOL Take 1,000 mg by mouth every 6 (six) hours as needed for moderate pain or headache.   amoxicillin 500 MG capsule Commonly known as:  AMOXIL Take 500 mg by mouth 3 (three) times daily. 4 tablets Per dental procedure   aspirin 325 MG EC tablet Take 1 tablet (325 mg total) by mouth 2 (two) times daily for 20 days. Then take one 81 mg aspirin once a day for three weeks. Then discontinue aspirin.   b complex vitamins tablet Take 1 tablet by mouth daily.   CALCIUM-VITAMIN D3 PO Take 1 tablet by mouth daily.   doxazosin 4 MG tablet Commonly known as:  CARDURA Take 4 mg by mouth every evening.   finasteride 5 MG tablet Commonly known as:  PROSCAR Take 5 mg by mouth daily.   FISH OIL PO Take 1 capsule by mouth daily.   HYDROcodone-acetaminophen 5-325 MG tablet Commonly known as:   NORCO/VICODIN Take 1-2 tablets by mouth every 6 (six) hours as needed for severe pain.   Magnesium 500 MG Caps Take 500 mg by mouth daily.   methocarbamol 500 MG tablet Commonly known as:  ROBAXIN Take 1 tablet (500 mg total) by mouth every 6 (six) hours as needed for muscle spasms.   pantoprazole 40 MG tablet Commonly known as:  PROTONIX Take 40 mg by mouth daily as needed (for acid reflux or heartburn).   POTASSIUM PO Take 1 tablet by mouth daily.   traMADol 50 MG tablet Commonly known as:  ULTRAM Take 1-2 tablets (50-100 mg total) by mouth every 6 (six) hours as needed for moderate pain. What changed:  reasons to take this   VITAMIN D3 PO Take 1 capsule by mouth daily.   ZINC PO Take 1 tablet by mouth daily.            Discharge Care Instructions  (From admission, onward)         Start     Ordered   12/18/18 0000  Weight bearing as tolerated     12/18/18 0758   12/18/18 0000  Change dressing    Comments:  You may change your dressing on Friday, then change the dressing daily with sterile 4 x 4 inch gauze dressing and paper tape.   12/18/18 0758         Follow-up Information    [name], MD. Schedule an appointment as soon as possible for a visit on 01/01/2019.   Specialty:  Orthopedic Surgery Contact information: [address] [place] [address] [phone]           Signed: Griffith Citron, PA-C Orthopedic Surgery 12/22/2018, 1:44 PM

## 2019-06-19 IMAGING — DX DG PORTABLE PELVIS
2 series · 2 of 2 positions shown · non-contrast
Comparison: Right hip radiographs-earlier same date

CLINICAL DATA: Post right total hip replacement

EXAM:
PORTABLE PELVIS 1-2 VIEWS

[pelvis ap (1 of 2)]
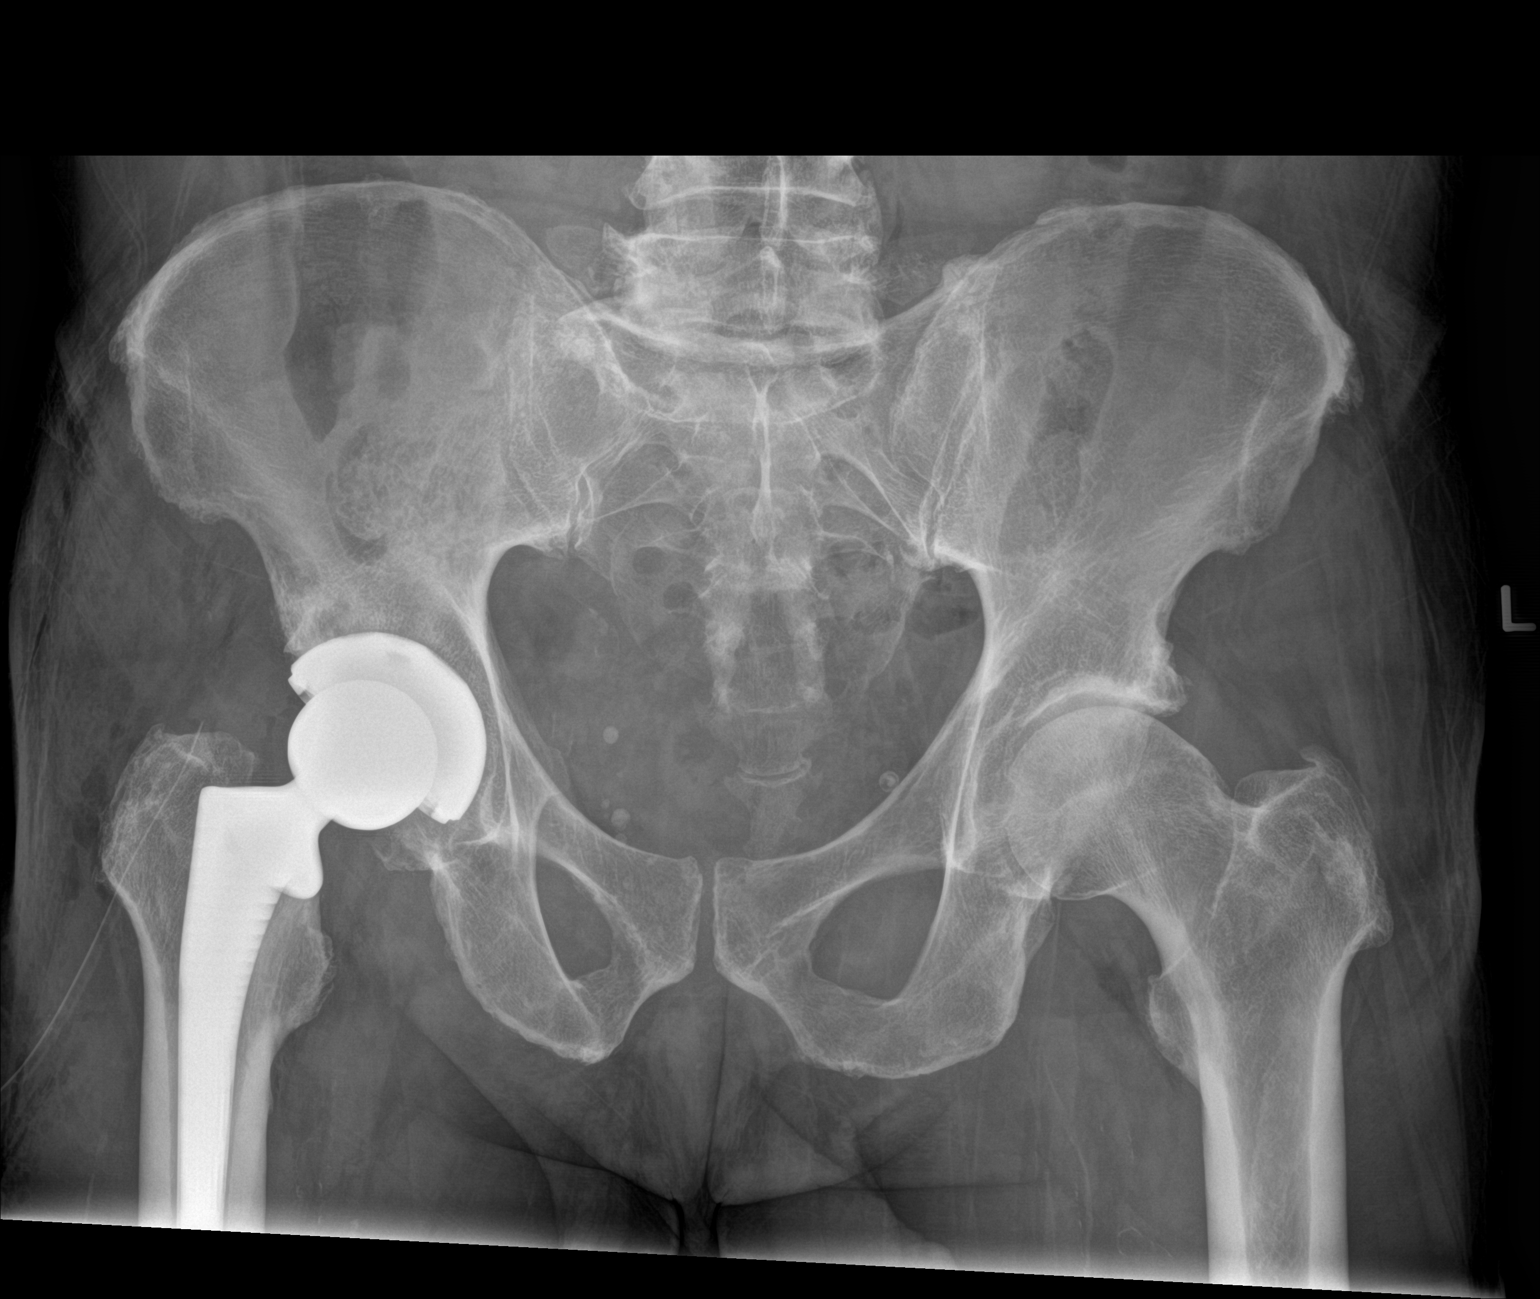

[pelvis ap (2 of 2)]
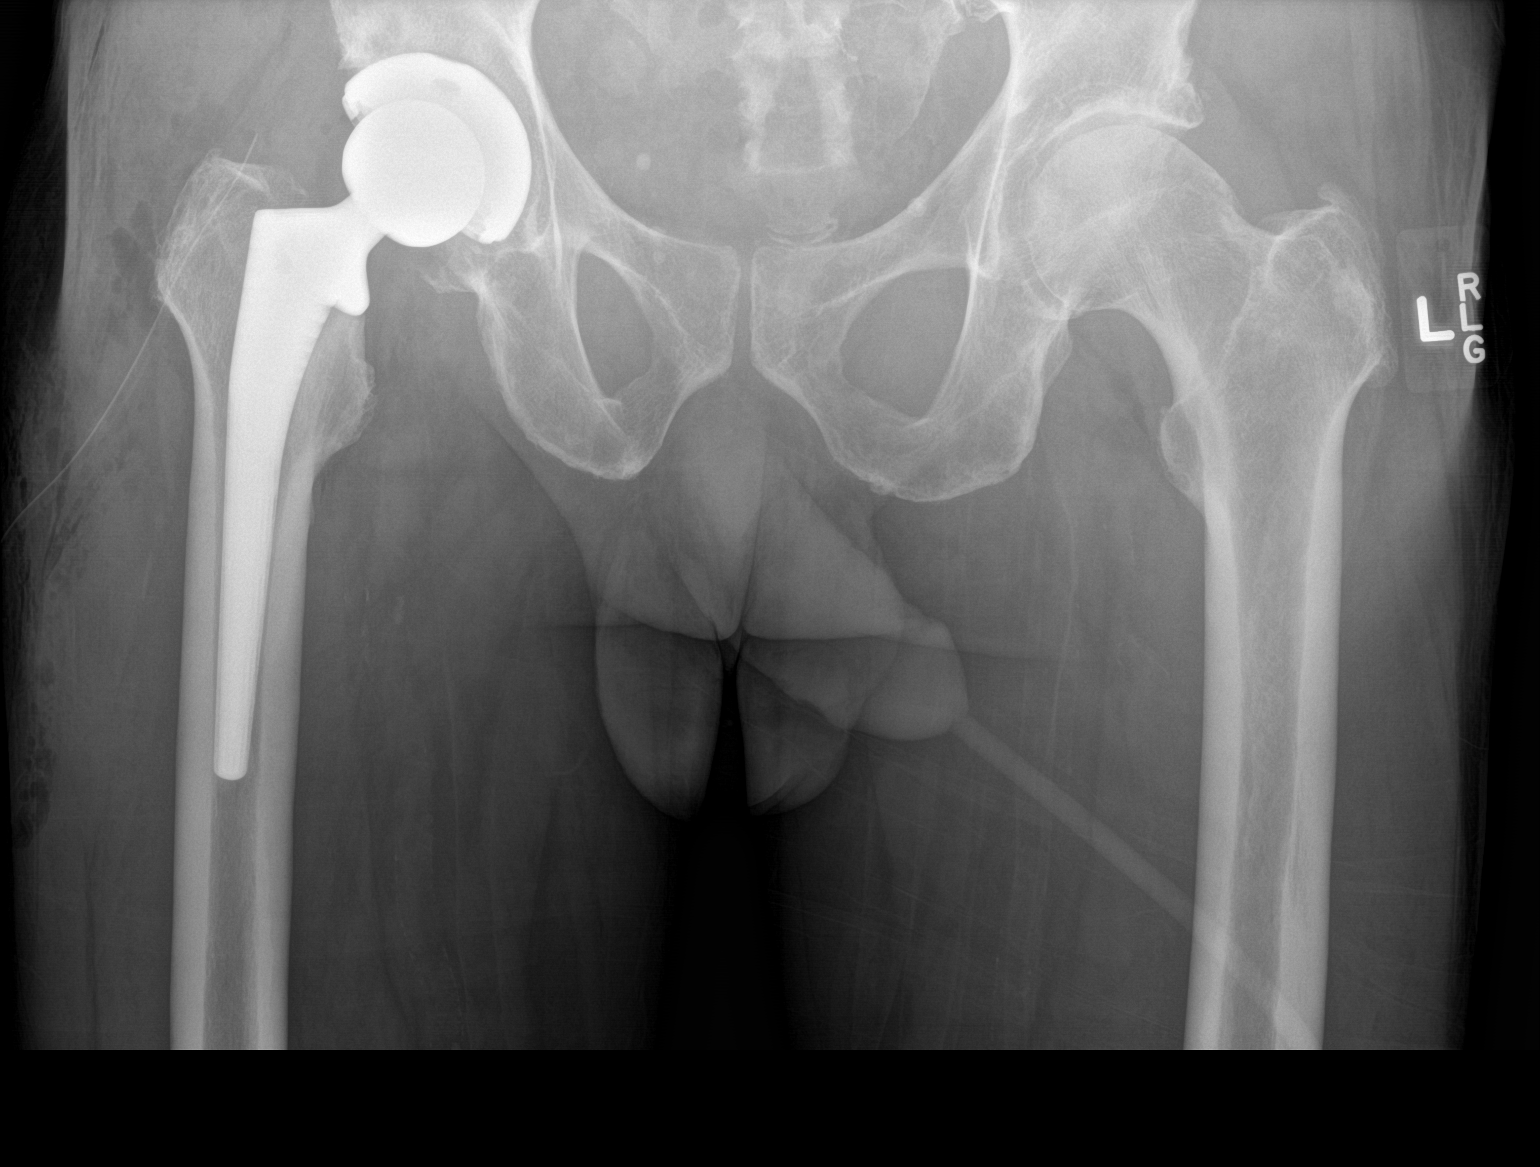

[2 of 2 positions shown; findings below may reference images not displayed]

FINDINGS: Post right total hip replacement. Alignment appears anatomic given
solitary AP projection. No definite evidence of hardware failure or
loosening. No definite fracture given AP projection.

Limited visualization of the pelvis is normal. Suspected mild
degenerative change within the contralateral left hip, incompletely
evaluated.

Degenerative changes is suspected within the lower lumbar spine,
incompletely evaluated

Several phleboliths overlie the lower pelvis bilaterally, right
greater than left.

A surgical drain is noted about the operative site. Scattered
expected postoperative subcutaneous emphysema. No radiopaque foreign
body.
IMPRESSION: Post right total hip replacement without evidence of complication.

## 2019-12-31 ENCOUNTER — Ambulatory Visit: Payer: Medicare PPO

## 2020-01-08 ENCOUNTER — Ambulatory Visit: Payer: Medicare PPO | Attending: Internal Medicine

## 2020-01-08 DIAGNOSIS — Z23 Encounter for immunization: Secondary | ICD-10-CM | POA: Insufficient documentation

## 2020-01-08 NOTE — Progress Notes (Signed)
   Covid-19 Vaccination Clinic  Name:  Luke Becker    MRN: [id number] DOB: June 08, 1946  01/08/2020  Mr. Gallick was observed post Covid-19 immunization for 15 minutes without incidence. He was provided with Vaccine Information Sheet and instruction to access the V-Safe system.   Mr. Stilwell was instructed to call 911 with any severe reactions post vaccine: Marland Kitchen Difficulty breathing  . Swelling of your face and throat  . A fast heartbeat  . A bad rash all over your body  . Dizziness and weakness    Immunizations Administered    Name Date Dose VIS Date Route   Pfizer COVID-19 Vaccine 01/08/2020  4:13 PM 0.3 mL 11/13/2019 Intramuscular   Manufacturer: [place]   Lot: [id number]   [place]: [id number]

## 2020-01-11 ENCOUNTER — Ambulatory Visit: Payer: Medicare PPO

## 2020-02-03 ENCOUNTER — Ambulatory Visit: Payer: Medicare PPO | Attending: Internal Medicine

## 2020-02-03 DIAGNOSIS — Z23 Encounter for immunization: Secondary | ICD-10-CM | POA: Insufficient documentation

## 2020-02-03 NOTE — Progress Notes (Signed)
   Covid-19 Vaccination Clinic  Name:  Luke Becker    MRN: [id number] DOB: 1946-08-27  02/03/2020  [name] was observed post Covid-19 immunization for 15 minutes without incident. He was provided with Vaccine Information Sheet and instruction to access the V-Safe system.   [name] was instructed to call 911 with any severe reactions post vaccine: Marland Kitchen Difficulty breathing  . Swelling of face and throat  . A fast heartbeat  . A bad rash all over body  . Dizziness and weakness   Immunizations Administered    Name Date Dose VIS Date Route   Pfizer COVID-19 Vaccine 02/03/2020 12:46 PM 0.3 mL 11/13/2019 Intramuscular   Manufacturer: [place]   Lot: [id number]   [place]: [id number]

## 2020-06-12 IMAGING — DX DG PORTABLE PELVIS
1 series · 1 of 1 positions shown · non-contrast
Comparison: None.

CLINICAL DATA: Status post left hip replacement

EXAM:
PORTABLE PELVIS 1-2 VIEWS

[pelvis ap]
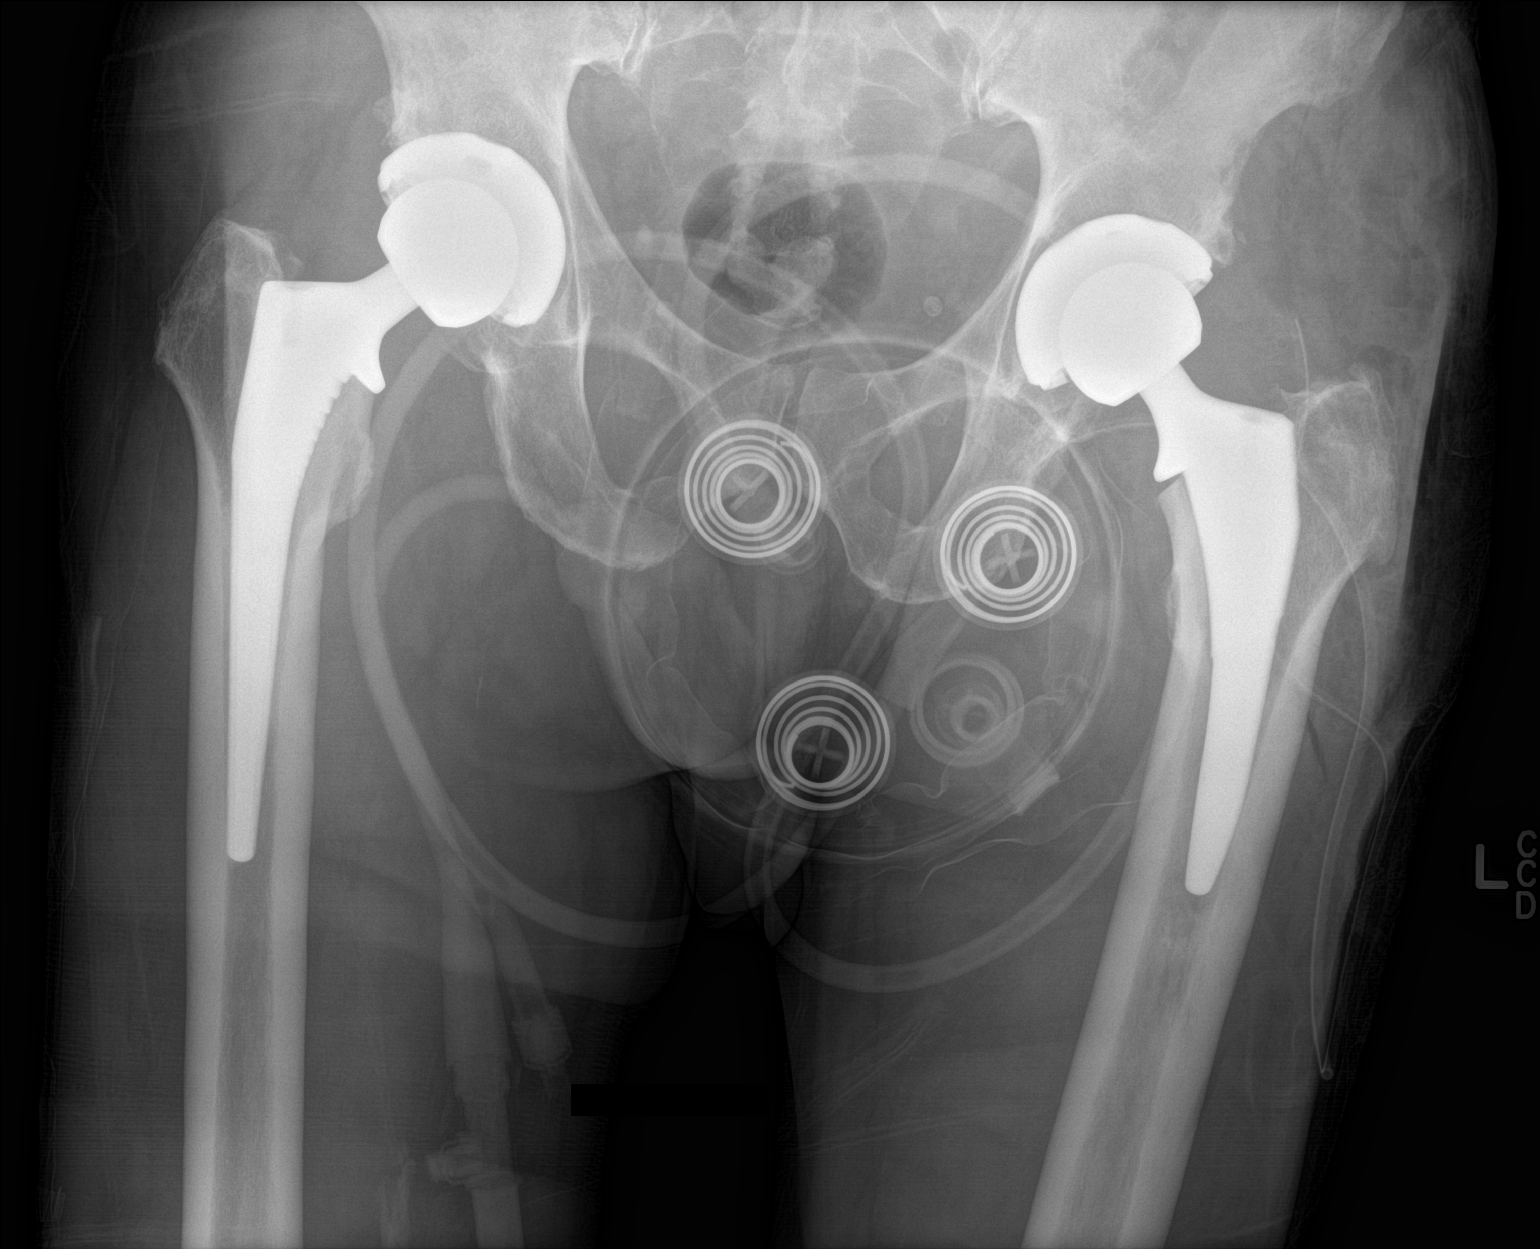

[1 of 1 positions shown; findings below may reference images not displayed]

FINDINGS: Bilateral hip replacements are noted. Surgical drains are noted on
the left. No soft tissue or acute bony abnormality is noted.
IMPRESSION: Status post left hip replacement

## 2022-10-04 ENCOUNTER — Ambulatory Visit: Payer: Self-pay | Admitting: Surgery

## 2022-10-04 NOTE — Progress Notes (Signed)
Second request: Sent message, via epic in basket, requesting orders in epic from Psychologist, sport and exercise.

## 2022-10-05 NOTE — Patient Instructions (Signed)
DUE TO COVID-19 ONLY TWO VISITORS  (aged 74 and older)  ARE ALLOWED TO COME WITH YOU AND STAY IN THE WAITING ROOM ONLY DURING PRE OP AND PROCEDURE.   **NO VISITORS ARE ALLOWED IN THE SHORT STAY AREA OR RECOVERY ROOM!!**  IF YOU WILL BE ADMITTED INTO THE HOSPITAL YOU ARE ALLOWED ONLY FOUR SUPPORT PEOPLE DURING VISITATION HOURS ONLY (7 AM -8PM)   The support person(s) must pass our screening, gel in and out, and wear a mask at all times, including in the patient's room. Patients must also wear a mask when staff or their support person are in the room. Visitors GUEST BADGE MUST BE WORN VISIBLY  One adult visitor may remain with you overnight and MUST be in the room by 8 P.M.     Your procedure is scheduled on: 10/09/22   Report to [facility] Main Entrance    Report to admitting at   7:45 AM   Call this number if you have problems the morning of surgery [phone]   Do not eat food :After Midnight.   After Midnight you may have the following liquids until _7:00_____ AM/  DAY OF SURGERY  Water Black Coffee (sugar ok, NO MILK/CREAM OR CREAMERS)  Tea (sugar ok, NO MILK/CREAM OR CREAMERS) regular and decaf                             Plain Jell-O (NO RED)                                           Fruit ices (not with fruit pulp, NO RED)                                     Popsicles (NO RED)                                                                  Juice: apple, WHITE grape, WHITE cranberry Sports drinks like Gatorade (NO RED)                      If you have questions, please contact your surgeon's office.    Oral Hygiene is also important to reduce your risk of infection.                                    Remember - BRUSH YOUR TEETH THE MORNING OF SURGERY WITH YOUR REGULAR TOOTHPASTE   Take these medicines the morning of surgery with A SIP OF WATER: Use your inhaler and bring it with you  Finasteride-Proscar                               You may not have any metal on your body including jewelry, and body piercing             Do not wear  lotions, powders, cologne, or deodorant  .               Men may shave face and neck.   Do not bring valuables to the hospital. [place].   Contacts, dentures or bridgework may not be worn into surgery.      Patients discharged on the day of surgery will not be allowed to drive home.  Someone NEEDS to stay with you for the first 24 hours after anesthesia.   Special Instructions: Bring a copy of your healthcare power of attorney and living will documents   the day of surgery if you haven't scanned them before.              Please read over the following fact sheets you were given: IF YOU HAVE QUESTIONS ABOUT YOUR PRE-OP INSTRUCTIONS PLEASE CALL [phone]    [facility] - Preparing for Surgery Before surgery, you can play an important role.  Because skin is not sterile, your skin needs to be as free of germs as possible.  You can reduce the number of germs on your skin by washing with CHG (chlorahexidine gluconate) soap before surgery.  CHG is an antiseptic cleaner which kills germs and bonds with the skin to continue killing germs even after washing. Please DO NOT use if you have an allergy to CHG or antibacterial soaps.  If your skin becomes reddened/irritated stop using the CHG and inform your nurse when you arrive at Short Stay.  You may shave your face/neck. Please follow these instructions carefully:  1.  Shower with CHG Soap the night before surgery and the  morning of Surgery.  2.  If you choose to wash your hair, wash your hair first as usual with your  normal  shampoo.  3.  After you shampoo, rinse your hair and body thoroughly to remove the  shampoo.                            4.  Use CHG as you would any other liquid soap.  You can  apply chg directly  to the skin and wash                       Gently with a scrungie or clean washcloth.  5.  Apply the CHG Soap to your body ONLY FROM THE NECK DOWN.   Do not use on face/ open                           Wound or open sores. Avoid contact with eyes, ears mouth and genitals (private parts).                       Wash face,  Genitals (private parts) with your normal soap.             6.  Wash thoroughly, paying special attention to the area where  your surgery  will be performed.  7.  Thoroughly rinse your body with warm water from the neck down.  8.  DO NOT shower/wash with your normal soap after using and rinsing off  the CHG Soap.                9.  Pat yourself dry with a clean towel.            10.  Wear clean pajamas.            11.  Place clean sheets on your bed the night of your first shower and do not  sleep with pets. Day of Surgery : Do not apply any lotions/deodorants the morning of surgery.  Please wear clean clothes to the hospital/surgery center.  FAILURE TO FOLLOW THESE INSTRUCTIONS MAY RESULT IN THE CANCELLATION OF YOUR SURGERY    ________________________________________________________________________

## 2022-10-08 ENCOUNTER — Encounter (HOSPITAL_COMMUNITY): Payer: Self-pay

## 2022-10-08 ENCOUNTER — Encounter (HOSPITAL_COMMUNITY)
Admission: RE | Admit: 2022-10-08 | Discharge: 2022-10-08 | Disposition: A | Discharge status: Home / Self Care | Payer: Medicare PPO | Source: Ambulatory Visit | Attending: Surgery | Admitting: Surgery

## 2022-10-08 ENCOUNTER — Other Ambulatory Visit: Payer: Self-pay

## 2022-10-08 DIAGNOSIS — I10 Essential (primary) hypertension: Secondary | ICD-10-CM | POA: Insufficient documentation

## 2022-10-08 DIAGNOSIS — Z01818 Encounter for other preprocedural examination: Secondary | ICD-10-CM | POA: Diagnosis present

## 2022-10-08 HISTORY — DX: Chronic kidney disease, unspecified: N18.9

## 2022-10-08 LAB — BASIC METABOLIC PANEL
Anion gap: 9 (ref 5–15)
BUN: 19 mg/dL (ref 8–23)
CO2: 24 mmol/L (ref 22–32)
Calcium: 9.1 mg/dL (ref 8.9–10.3)
Chloride: 107 mmol/L (ref 98–111)
Creatinine, Ser: 1.44 mg/dL — ABNORMAL HIGH (ref 0.61–1.24)
GFR, Estimated: 51 mL/min — ABNORMAL LOW (ref >60)
Glucose, Bld: 101 mg/dL — ABNORMAL HIGH (ref 70–99)
Potassium: 3.9 mmol/L (ref 3.5–5.1)
Sodium: 140 mmol/L (ref 135–145)

## 2022-10-08 LAB — CBC
HCT: 42.8 % (ref 39.0–52.0)
Hemoglobin: 14.6 g/dL (ref 13.0–17.0)
MCH: 32.4 pg (ref 26.0–34.0)
MCHC: 34.1 g/dL (ref 30.0–36.0)
MCV: 94.9 fL (ref 80.0–100.0)
Platelets: 225 10*3/uL (ref 150–400)
RBC: 4.51 MIL/uL (ref 4.22–5.81)
RDW: 12 % (ref 11.5–15.5)
WBC: 5.4 10*3/uL (ref 4.0–10.5)
nRBC: 0 % (ref 0.0–0.2)

## 2022-10-08 NOTE — Progress Notes (Signed)
Anesthesia note:  Bowel prep reminder:  NA  PCP - Jenny Reichmann FNP Cardiologist -none Other-   Chest x-ray - no EKG - 10/08/22-chart Stress Test - no ECHO - no Cardiac Cath - no CABG-no Pacemaker/ICD device last checked:NA  Sleep Study - no CPAP -   Pt is pre diabetic-no CBG at PAT visit- Fasting Blood Sugar at home- Checks Blood Sugar _____  Blood Thinner:no Blood Thinner Instructions: Aspirin Instructions: Last Dose:  Anesthesia review:  /No    Patient denies shortness of breath, fever, cough and chest pain at PAT appointment Pt is a runner he has no SOB with activities. He has urinary retention due to BPH . Had problems voiding post op last surgery  Patient verbalized understanding of instructions that were given to them at the PAT appointment. Patient was also instructed that they will need to review over the PAT instructions again at home before surgery.yes

## 2022-10-09 ENCOUNTER — Other Ambulatory Visit: Payer: Self-pay

## 2022-10-09 ENCOUNTER — Ambulatory Visit (HOSPITAL_BASED_OUTPATIENT_CLINIC_OR_DEPARTMENT_OTHER): Payer: Medicare PPO | Admitting: Anesthesiology

## 2022-10-09 ENCOUNTER — Ambulatory Visit (HOSPITAL_COMMUNITY): Payer: Medicare PPO | Admitting: Anesthesiology

## 2022-10-09 ENCOUNTER — Encounter (HOSPITAL_COMMUNITY)
Admission: RE | Disposition: A | Discharge status: Home / Self Care | Payer: Self-pay | Source: Home / Self Care | Attending: Surgery

## 2022-10-09 ENCOUNTER — Encounter (HOSPITAL_COMMUNITY): Payer: Self-pay | Admitting: Surgery

## 2022-10-09 ENCOUNTER — Ambulatory Visit (HOSPITAL_COMMUNITY)
Admission: RE | Admit: 2022-10-09 | Discharge: 2022-10-09 | Disposition: A | Discharge status: Home / Self Care | Payer: Medicare PPO | Attending: Surgery | Admitting: Surgery

## 2022-10-09 DIAGNOSIS — K429 Umbilical hernia without obstruction or gangrene: Secondary | ICD-10-CM

## 2022-10-09 DIAGNOSIS — I10 Essential (primary) hypertension: Secondary | ICD-10-CM

## 2022-10-09 HISTORY — PX: UMBILICAL HERNIA REPAIR: SHX196

## 2022-10-09 SURGERY — REPAIR, HERNIA, UMBILICAL, ADULT
Anesthesia: General

## 2022-10-09 MED ORDER — BUPIVACAINE-EPINEPHRINE 0.25% -1:200000 IJ SOLN
INTRAMUSCULAR | Status: DC | PRN
  Administered 2022-10-09: 30 mL

## 2022-10-09 MED ORDER — OXYCODONE HCL 5 MG PO TABS: 5.0000 mg | ORAL_TABLET | Freq: Once | ORAL | Status: DC | PRN

## 2022-10-09 MED ORDER — EPHEDRINE SULFATE-NACL 50-0.9 MG/10ML-% IV SOSY
PREFILLED_SYRINGE | INTRAVENOUS | Status: DC | PRN
  Administered 2022-10-09 (×2): 10 mg via INTRAVENOUS

## 2022-10-09 MED ORDER — ORAL CARE MOUTH RINSE: 15.0000 mL | Freq: Once | OROMUCOSAL | Status: AC

## 2022-10-09 MED ORDER — CEFAZOLIN SODIUM-DEXTROSE 2-4 GM/100ML-% IV SOLN
INTRAVENOUS | Status: AC
  Filled 2022-10-09: qty 100

## 2022-10-09 MED ORDER — ROCURONIUM BROMIDE 10 MG/ML (PF) SYRINGE
PREFILLED_SYRINGE | INTRAVENOUS | Status: DC | PRN
  Administered 2022-10-09: 70 mg via INTRAVENOUS

## 2022-10-09 MED ORDER — OXYCODONE HCL 5 MG/5ML PO SOLN: 5.0000 mg | Freq: Once | ORAL | Status: DC | PRN

## 2022-10-09 MED ORDER — ONDANSETRON HCL 4 MG/2ML IJ SOLN
INTRAMUSCULAR | Status: DC | PRN
  Administered 2022-10-09: 4 mg via INTRAVENOUS

## 2022-10-09 MED ORDER — 0.9 % SODIUM CHLORIDE (POUR BTL) OPTIME
TOPICAL | Status: DC | PRN
  Administered 2022-10-09: 1000 mL

## 2022-10-09 MED ORDER — FENTANYL CITRATE (PF) 250 MCG/5ML IJ SOLN
INTRAMUSCULAR | Status: AC
  Filled 2022-10-09: qty 5

## 2022-10-09 MED ORDER — TRAMADOL HCL 50 MG PO TABS: 50.0000 mg | ORAL_TABLET | Freq: Four times a day (QID) | ORAL | 0 refills | Status: AC | PRN

## 2022-10-09 MED ORDER — BUPIVACAINE-EPINEPHRINE (PF) 0.25% -1:200000 IJ SOLN
INTRAMUSCULAR | Status: AC
  Filled 2022-10-09: qty 30

## 2022-10-09 MED ORDER — DEXAMETHASONE SODIUM PHOSPHATE 10 MG/ML IJ SOLN
INTRAMUSCULAR | Status: DC | PRN
  Administered 2022-10-09: 5 mg via INTRAVENOUS

## 2022-10-09 MED ORDER — FENTANYL CITRATE (PF) 100 MCG/2ML IJ SOLN
INTRAMUSCULAR | Status: DC | PRN
  Administered 2022-10-09: 100 ug via INTRAVENOUS

## 2022-10-09 MED ORDER — GABAPENTIN 300 MG PO CAPS
ORAL_CAPSULE | ORAL | Status: AC
  Filled 2022-10-09: qty 1

## 2022-10-09 MED ORDER — ACETAMINOPHEN 500 MG PO TABS
ORAL_TABLET | ORAL | Status: AC
  Filled 2022-10-09: qty 2

## 2022-10-09 MED ORDER — BUPIVACAINE LIPOSOME 1.3 % IJ SUSP: 20.0000 mL | Freq: Once | INTRAMUSCULAR | Status: DC

## 2022-10-09 MED ORDER — PHENYLEPHRINE 80 MCG/ML (10ML) SYRINGE FOR IV PUSH (FOR BLOOD PRESSURE SUPPORT)
PREFILLED_SYRINGE | INTRAVENOUS | Status: DC | PRN
  Administered 2022-10-09: 160 ug via INTRAVENOUS

## 2022-10-09 MED ORDER — HYDROMORPHONE HCL 1 MG/ML IJ SOLN: 0.2500 mg | INTRAMUSCULAR | Status: DC | PRN

## 2022-10-09 MED ORDER — CHLORHEXIDINE GLUCONATE CLOTH 2 % EX PADS: 6.0000 | MEDICATED_PAD | Freq: Once | CUTANEOUS | Status: DC

## 2022-10-09 MED ORDER — ACETAMINOPHEN 500 MG PO TABS
1000.0000 mg | ORAL_TABLET | ORAL | Status: AC
  Administered 2022-10-09: 1000 mg via ORAL

## 2022-10-09 MED ORDER — LACTATED RINGERS IV SOLN: INTRAVENOUS | Status: DC

## 2022-10-09 MED ORDER — PROPOFOL 10 MG/ML IV BOLUS
INTRAVENOUS | Status: AC
  Filled 2022-10-09: qty 20

## 2022-10-09 MED ORDER — GABAPENTIN 300 MG PO CAPS
300.0000 mg | ORAL_CAPSULE | ORAL | Status: AC
  Administered 2022-10-09: 300 mg via ORAL

## 2022-10-09 MED ORDER — PROPOFOL 10 MG/ML IV BOLUS
INTRAVENOUS | Status: DC | PRN
  Administered 2022-10-09: 140 mg via INTRAVENOUS

## 2022-10-09 MED ORDER — ONDANSETRON HCL 4 MG/2ML IJ SOLN: 4.0000 mg | Freq: Once | INTRAMUSCULAR | Status: DC | PRN

## 2022-10-09 MED ORDER — SUGAMMADEX SODIUM 200 MG/2ML IV SOLN
INTRAVENOUS | Status: DC | PRN
  Administered 2022-10-09: 200 mg via INTRAVENOUS

## 2022-10-09 MED ORDER — LIDOCAINE 2% (20 MG/ML) 5 ML SYRINGE
INTRAMUSCULAR | Status: DC | PRN
  Administered 2022-10-09: 100 mg via INTRAVENOUS

## 2022-10-09 MED ORDER — CHLORHEXIDINE GLUCONATE 0.12 % MT SOLN
15.0000 mL | Freq: Once | OROMUCOSAL | Status: AC
  Administered 2022-10-09: 15 mL via OROMUCOSAL

## 2022-10-09 MED ORDER — CEFAZOLIN SODIUM-DEXTROSE 2-4 GM/100ML-% IV SOLN
2.0000 g | INTRAVENOUS | Status: AC
  Administered 2022-10-09: 2 g via INTRAVENOUS

## 2022-10-09 SURGICAL SUPPLY — 23 items
COTTONBALL LRG STERILE PKG (GAUZE/BANDAGES/DRESSINGS) ×1 IMPLANT
COVER SURGICAL LIGHT HANDLE (MISCELLANEOUS) ×1 IMPLANT
DERMABOND ADVANCED .7 DNX12 (GAUZE/BANDAGES/DRESSINGS) ×1 IMPLANT
DRAPE LAPAROTOMY T 98X78 PEDS (DRAPES) ×1 IMPLANT
DRSG TEGADERM 4X4.75 (GAUZE/BANDAGES/DRESSINGS) ×1 IMPLANT
ELECT REM PT RETURN 15FT ADLT (MISCELLANEOUS) ×1 IMPLANT
GLOVE BIOGEL PI IND STRL 8 (GLOVE) ×1 IMPLANT
GOWN STRL REUS W/ TWL XL LVL3 (GOWN DISPOSABLE) ×1 IMPLANT
GOWN STRL REUS W/TWL XL LVL3 (GOWN DISPOSABLE) ×1
KIT BASIN OR (CUSTOM PROCEDURE TRAY) ×1 IMPLANT
KIT TURNOVER KIT A (KITS) IMPLANT
MESH BARD SOFT 2X4IN (Mesh General) IMPLANT
NEEDLE HYPO 22GX1.5 SAFETY (NEEDLE) ×1 IMPLANT
NS IRRIG 1000ML POUR BTL (IV SOLUTION) ×1 IMPLANT
PACK GENERAL/GYN (CUSTOM PROCEDURE TRAY) ×1 IMPLANT
SPIKE FLUID TRANSFER (MISCELLANEOUS) ×1 IMPLANT
SUT MNCRL AB 4-0 PS2 18 (SUTURE) ×1 IMPLANT
SUT NOVA NAB GS-21 0 18 T12 DT (SUTURE) IMPLANT
SUT PDS AB 2-0 CT2 27 (SUTURE) IMPLANT
SUT VIC AB 3-0 SH 8-18 (SUTURE) ×1 IMPLANT
SYR CONTROL 10ML LL (SYRINGE) ×1 IMPLANT
TOWEL OR 17X26 10 PK STRL BLUE (TOWEL DISPOSABLE) ×1 IMPLANT
TRAY FOLEY MTR SLVR 16FR STAT (SET/KITS/TRAYS/PACK) IMPLANT

## 2022-10-09 NOTE — Op Note (Signed)
   Patient: Luke Becker ([birth year], [id number])  Date of Surgery: 10/09/2022   Preoperative Diagnosis: UMBILICAL HERNIA   Postoperative Diagnosis: UMBILICAL HERNIA   Surgical Procedure: UMBILICAL HERNIA REPAIR, POSSIBLY WITH MESH:    Operative Team Members:  Surgeon(s) and Role:    * Carla Rashad, Nickola Major, MD - Primary   Anesthesiologist: Myrtie Soman, MD CRNA: Gean Maidens, CRNA; Gerald Leitz, CRNA   Anesthesia: General   Fluids:  Total I/O In: 1100 [I.V.:1000; IV Piggyback:100] Out: 5 [MVHQI:6]  Complications: * No complications entered in OR log *  Drains:  none   Specimen: None  Disposition:  PACU - hemodynamically stable.  Plan of Care: Discharge to home after PACU    Indications for Procedure: [name] is a 76 y.o. male who presented with an umbilical hernia.  I recommended open repair.  The procedure, its risks, benefits and alternatives were discussed and the patient granted consent to proceed.  We will proceed as scheduled.  Findings:  Hernia Location: Umbilical Hernia Size:  1 cm x 1 cm  Mesh Size &Type:  5 cm wide x 7.5 cm tall Bard Soft mesh Mesh Position: Preperitoneal  Description of Procedure:  The patient was positioned supine, padded and secured to the bed.  The abdomen was widely prepped and draped.  A time out procedure was performed.    A curvilinear incision was made below the umbilicus and dissection was carried down through the subcutaneous tissue to the level of the fascia.  The umbilical stalk was encircled and the hernia sac amputated off the umbilical skin.  The hernia defect was measured as 1 cm wide by 1 cm in vertical dimension.    An umbilical hernia repair with mesh was performed.  The hernia sac was scored along the perimeter of the fascial defect, entering the pre peritoneal plane.  A wide pre peritoneal dissection was performed creating a space for mesh placement.  A piece of Bard Soft was opened and  trimmed to 5cm wide x 7.5 cm tall and deployed into the pre peritoneal space.  The mesh laid in taut position in the pre peritoneal plane and did not require fixation.  The space was irrigated with normal saline.  The fascial defect was closed horizontally, overtop the mesh with figure of eight 2-0 PDS suture.  The umbilical skin was tacked down to the fascial repair with 4-0 Monocryl suture.  The skin was closed with 4-0 Monocryl subcuticular suture and skin glue.     Louanna Raw, MD General, Bariatric, & Minimally Invasive Surgery [facility], [place]

## 2022-10-09 NOTE — Transfer of Care (Signed)
Immediate Anesthesia Transfer of Care Note  Patient: Luke Becker  Procedure(s) Performed: UMBILICAL HERNIA REPAIR, POSSIBLY WITH MESH  Patient Location: PACU  Anesthesia Type:General  Level of Consciousness: sedated, patient cooperative, and responds to stimulation  Airway & Oxygen Therapy: Patient Spontanous Breathing and Patient connected to face mask oxygen  Post-op Assessment: Report given to RN and Post -op Vital signs reviewed and stable  Post vital signs: Reviewed and stable  Last Vitals:  Vitals Value Taken Time  BP    Temp    Pulse 60 10/09/22 1103  Resp 14 10/09/22 1103  SpO2 100 % 10/09/22 1103  Vitals shown include unvalidated device data.  Last Pain:  Vitals:   10/09/22 0835  TempSrc:   PainSc: 0-No pain         Complications: No notable events documented.

## 2022-10-09 NOTE — Anesthesia Preprocedure Evaluation (Signed)
Anesthesia Evaluation  Patient identified by MRN, date of birth, ID band Patient awake    Reviewed: Allergy & Precautions, H&P , NPO status , Patient's Chart, lab work & pertinent test results  Airway Mallampati: II  TM Distance: >3 FB Neck ROM: Full    Dental no notable dental hx.    Pulmonary neg pulmonary ROS   Pulmonary exam normal breath sounds clear to auscultation       Cardiovascular negative cardio ROS Normal cardiovascular exam Rhythm:Regular Rate:Normal     Neuro/Psych negative neurological ROS  negative psych ROS   GI/Hepatic Neg liver ROS,GERD  ,,  Endo/Other  negative endocrine ROS    Renal/GU Renal InsufficiencyRenal disease  negative genitourinary   Musculoskeletal negative musculoskeletal ROS (+)    Abdominal   Peds negative pediatric ROS (+)  Hematology negative hematology ROS (+)   Anesthesia Other Findings   Reproductive/Obstetrics negative OB ROS                             Anesthesia Physical Anesthesia Plan  ASA: 2  Anesthesia Plan: General   Post-op Pain Management: Ofirmev IV (intra-op)*   Induction: Intravenous  PONV Risk Score and Plan: 2 and Ondansetron, Dexamethasone and Treatment may vary due to age or medical condition  Airway Management Planned: Oral ETT  Additional Equipment:   Intra-op Plan:   Post-operative Plan: Extubation in OR  Informed Consent: I have reviewed the patients History and Physical, chart, labs and discussed the procedure including the risks, benefits and alternatives for the proposed anesthesia with the patient or authorized representative who has indicated his/her understanding and acceptance.     Dental advisory given  Plan Discussed with: CRNA and Surgeon  Anesthesia Plan Comments:        Anesthesia Quick Evaluation

## 2022-10-09 NOTE — Discharge Instructions (Signed)
 VENTRAL HERNIA REPAIR POST OPERATIVE INSTRUCTIONS  Thinking Clearly  The anesthesia may cause you to feel different for 1 or 2 days. Do not drive, drink alcohol, or make any big decisions for at least 2 days.  Nutrition When you wake up, you will be able to drink small amounts of liquid. If you do not feel sick, you can slowly advance your diet to regular foods. Continue to drink lots of fluids, usually about 8 to 10 glasses per day. Eat a high-fiber diet so you don't strain during bowel movements. High-Fiber Foods Foods high in fiber include beans, bran cereals and whole-grain breads, peas, dried fruit (figs, apricots, and dates), raspberries, blackberries, strawberries, sweet corn, broccoli, baked potatoes with skin, plums, pears, apples, greens, and nuts. Activity Slowly increase your activity. Be sure to get up and walk every hour or so to prevent blood clots. No heavy lifting or strenuous activity for 4 weeks following surgery to prevent hernias at your incision sites or recurrence of your hernia. It is normal to feel tired. You may need more sleep than usual.  Get your rest but make sure to get up and move around frequently to prevent blood clots and pneumonia.  Work and Return to School You can go back to work when you feel well enough. Discuss the timing with your surgeon. You can usually go back to school or work 1 week or less after an laparoscopic or an open repair. If your work requires heavy lifting or strenuous activity you need to be placed on light duty for 4 weeks following surgery. You can return to gym class, sports or other physical activities 4 weeks after surgery.  Wound Care You may experience significant bruising throughout the abdominal wall that may track down into the groin including into the scrotum in males.  Rest, elevating the groin and scrotum above the level of the heart, ice and compression with tight fitting underwear or an abdominal binder can help.   Always wash your hands before and after touching near your incision site. Do not soak in a bathtub until cleared at your follow up appointment. You may take a shower 24 hours after surgery. A small amount of drainage from the incision is normal. If the drainage is thick and yellow or the site is red, you may have an infection, so call your surgeon. If you have a drain in one of your incisions, it will be taken out in office when the drainage stops. Steri-Strips will fall off in 7 to 10 days or they will be removed during your first office visit. If you have dermabond glue covering over the incision, allow the glue to flake off on its own. Protect the new skin, especially from the sun. The sun can burn and cause darker scarring. Your scar will heal in about 4 to 6 weeks and will become softer and continue to fade over the next year.  The cosmetic appearance of the incisions will improve over the course of the first year after surgery. Sensation around your incision will return in a few weeks or months.  Bowel Movements After intestinal surgery, you may have loose watery stools for several days. If watery diarrhea lasts longer than 3 days, contact your surgeon. Pain medication (narcotics) can cause constipation. Increase the fiber in your diet with high-fiber foods if you are constipated. You can take an over the counter stool softener like Colace to avoid constipation.  Additional over the counter medications can also be used   if Colace isn't sufficient (for example, Milk of Magnesia or Miralax).  Pain The amount of pain is different for each person. Some people need only 1 to 3 doses of pain control medication, while others need more. Take alternating doses of tylenol and ibuprofen around the clock for the first five days following surgery.  This will provide a baseline of pain control and help with inflammation.  Take the narcotic pain medication in addition if needed for severe pain.  Contact  Your Surgeon at [phone], if you have: Pain that will not go away Pain that gets worse A fever of more than 101F (38.3C) Repeated vomiting Swelling, redness, bleeding, or bad-smelling drainage from your wound site Strong abdominal pain No bowel movement or unable to pass gas for 3 days Watery diarrhea lasting longer than 3 days  Pain Control The goal of pain control is to minimize pain, keep you moving and help you heal. Your surgical team will work with you on your pain plan. Most often a combination of therapies and medications are used to control your pain. You may also be given medication (local anesthetic) at the surgical site. This may help control your pain for several days. Extreme pain puts extra stress on your body at a time when your body needs to focus on healing. Do not wait until your pain has reached a level "10" or is unbearable before telling your doctor or nurse. It is much easier to control pain before it becomes severe. Following a laparoscopic procedure, pain is sometimes felt in the shoulder. This is due to the gas inserted into your abdomen during the procedure. Moving and walking helps to decrease the gas and the right shoulder pain.  Use the guide below for ways to manage your post-operative pain. Learn more by going to facs.org/safepaincontrol.  How Intense Is My Pain Common Therapies to Feel Better       I hardly notice my pain, and it does not interfere with my activities.  I notice my pain and it distracts me, but I can still do activities (sitting up, walking, standing).  Non-Medication Therapies  Ice (in a bag, applied over clothing at the surgical site), elevation, rest, meditation, massage, distraction (music, TV, play) walking and mild exercise Splinting the abdomen with pillows +  Non-Opioid Medications Acetaminophen (Tylenol) Non-steroidal anti-inflammatory drugs (NSAIDS) Aspirin, Ibuprofen (Motrin, Advil) Naproxen (Aleve) Take these as  needed, when you feel pain. Both acetaminophen and NSAIDs help to decrease pain and swelling (inflammation).      My pain is hard to ignore and is more noticeable even when I rest.  My pain interferes with my usual activities.  Non-Medication Therapies  +  Non-Opioid medications  Take on a regular schedule (around-the-clock) instead of as needed. (For example, Tylenol every 6 hours at 9:00 am, 3:00 pm, 9:00 pm, 3:00 am and Motrin every 6 hours at 12:00 am, 6:00 am, 12:00 pm, 6:00 pm)         I am focused on my pain, and I am not doing my daily activities.  I am groaning in pain, and I cannot sleep. I am unable to do anything.  My pain is as bad as it could be, and nothing else matters.  Non-Medication Therapies  +  Around-the-Clock Non-Opioid Medications  +  Short-acting opioids  Opioids should be used with other medications to manage severe pain. Opioids block pain and give a feeling of euphoria (feel high). Addiction, a serious side effect of opioids, is   rare with short-term (a few days) use.  Examples of short-acting opioids include: Tramadol (Ultram), Hydrocodone (Norco, Vicodin), Hydromorphone (Dilaudid), Oxycodone (Oxycontin)     The above directions have been adapted from the [employer] Surgical Patient Education Program.  Please refer to the ACS website if needed: https://www.white.net/   Louanna Raw, [facility], [place] [address] [place], [place], [place]  [address] ?  [address], [place], [place]   [address] [phone] ? [phone] ? [phone] Web site: [email or web address]

## 2022-10-09 NOTE — Anesthesia Procedure Notes (Signed)
Procedure Name: Intubation Date/Time: 10/09/2022 10:25 AM  Performed by: Gean Maidens, CRNAPre-anesthesia Checklist: Patient identified, Emergency Drugs available, Suction available, Patient being monitored and Timeout performed Patient Re-evaluated:Patient Re-evaluated prior to induction Oxygen Delivery Method: Circle system utilized Preoxygenation: Pre-oxygenation with 100% oxygen Induction Type: IV induction Ventilation: Mask ventilation without difficulty Laryngoscope Size: Mac and 4 Grade View: Grade I Tube type: Oral Tube size: 7.5 mm Number of attempts: 1 Airway Equipment and Method: Stylet Secured at: 23 cm Tube secured with: Tape Dental Injury: Teeth and Oropharynx as per pre-operative assessment

## 2022-10-09 NOTE — Anesthesia Postprocedure Evaluation (Signed)
Anesthesia Post Note  Patient: Luke Becker  Procedure(s) Performed: UMBILICAL HERNIA REPAIR, POSSIBLY WITH MESH     Patient location during evaluation: PACU Anesthesia Type: General Level of consciousness: awake and alert Pain management: pain level controlled Vital Signs Assessment: post-procedure vital signs reviewed and stable Respiratory status: spontaneous breathing, nonlabored ventilation, respiratory function stable and patient connected to nasal cannula oxygen Cardiovascular status: blood pressure returned to baseline and stable Postop Assessment: no apparent nausea or vomiting Anesthetic complications: no  No notable events documented.  Last Vitals:  Vitals:   10/09/22 1130 10/09/22 1145  BP: 136/75 (!) 142/77  Pulse: (!) 52 (!) 56  Resp: 13 14  Temp:  36.6 C  SpO2: 98% 99%    Last Pain:  Vitals:   10/09/22 1130  TempSrc:   PainSc: 0-No pain                 Liel Rudden S

## 2022-10-09 NOTE — H&P (Signed)
Admitting Physician: Nickola Major Assyria Morreale  Service: General Surgery  CC: Umbilical hernia  Subjective   HPI: Luke Becker is an 76 y.o. 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 No pertinent family history.  Social:  reports that he has never smoked. He has never used smokeless tobacco. He reports that he does not currently use alcohol after a past usage of about 2.0 standard drinks of alcohol per week. He reports that he does not use drugs.  Allergies:  Allergies  Allergen Reactions   Azithromycin Other (See Comments)    Upset stomach    Medications: Current Outpatient Medications  Medication Instructions   acetaminophen (TYLENOL) 1,000 mg, Oral, Every 6 hours PRN   albuterol (VENTOLIN HFA) 108 (90 Base) MCG/ACT inhaler 1-2 puffs, Inhalation, Every 6 hours PRN   atorvastatin (LIPITOR) 10 mg, Oral, Every evening   Creatine POWD 1 Scoop, Oral, Daily PRN   doxazosin (CARDURA) 4 mg, Daily at bedtime   finasteride (PROSCAR) 5 mg, Oral, Every morning   Glutamine POWD 1 Scoop, Oral, Daily PRN   Magnesium 500 MG TABS Oral, Every morning   MSM 1,000 mg,  Oral, Every morning   Protein POWD 1 Scoop, Oral, Daily PRN   silodosin (RAPAFLO) 8 mg, Oral, Daily, Pt takes at evening    ROS - all of the below systems have been reviewed with the patient and positives are indicated with bold text General: chills, fever or night sweats Eyes: blurry vision or double vision ENT: epistaxis or sore throat Allergy/Immunology: itchy/watery eyes or nasal congestion Hematologic/Lymphatic: bleeding problems, blood clots or swollen lymph nodes Endocrine: temperature intolerance or unexpected weight changes Breast: new or changing breast lumps or nipple discharge Resp: cough, shortness of breath, or wheezing CV: chest pain or dyspnea on exertion GI: as per HPI GU: dysuria, trouble voiding, or hematuria MSK: joint pain or joint stiffness Neuro: TIA or stroke symptoms Derm: pruritus and skin lesion changes Psych: anxiety and depression  Objective   PE Blood pressure 138/88, pulse (!) 52, temperature 98.3 F (36.8 C), temperature source Oral, resp. rate 18, height '5\' 8"'$  (1.727 m), weight 79.4 kg, SpO2 98 %. Constitutional: NAD; conversant; no deformities Eyes: Moist conjunctiva; no lid lag; anicteric; PERRL Neck: Trachea midline; no thyromegaly Lungs: Normal respiratory effort; no tactile fremitus CV: RRR; no palpable thrills; no pitting edema GI: Abd Umbilical hernai; no palpable hepatosplenomegaly MSK: Normal range of motion of extremities; no clubbing/cyanosis Psychiatric: Appropriate affect; alert and oriented x3 Lymphatic: No palpable cervical or axillary lymphadenopathy  Results for orders placed or performed during the hospital  encounter of 10/08/22 (from the past 24 hour(s))  Basic metabolic panel per protocol     Status: Abnormal   Collection Time: 10/08/22  1:03 PM  Result Value Ref Range   Sodium 140 135 - 145 mmol/L   Potassium 3.9 3.5 - 5.1 mmol/L   Chloride 107 98 - 111 mmol/L   CO2 24 22 - 32 mmol/L   Glucose, Bld 101 (H) 70 - 99  mg/dL   BUN 19 8 - 23 mg/dL   Creatinine, Ser 1.44 (H) 0.61 - 1.24 mg/dL   Calcium 9.1 8.9 - 10.3 mg/dL   GFR, Estimated 51 (L) >60 mL/min   Anion gap 9 5 - 15  CBC per protocol     Status: None   Collection Time: 10/08/22  1:03 PM  Result Value Ref Range   WBC 5.4 4.0 - 10.5 K/uL   RBC 4.51 4.22 - 5.81 MIL/uL   Hemoglobin 14.6 13.0 - 17.0 g/dL   HCT 42.8 39.0 - 52.0 %   MCV 94.9 80.0 - 100.0 fL   MCH 32.4 26.0 - 34.0 pg   MCHC 34.1 30.0 - 36.0 g/dL   RDW 12.0 11.5 - 15.5 %   Platelets 225 150 - 400 K/uL   nRBC 0.0 0.0 - 0.2 %    Imaging Orders  No imaging studies ordered today     Assessment and Plan   Luke Becker is an 75 y.o. male with an umbilical hernia, here for repair.  I recommended umbilical hernia repair with mesh.  The procedure, its risks, benefits and alternatives were discussed and the patient granted consent to proceed.      ICD-10-CM   1. Hypertension, unspecified type  I10 CANCELED: Basic metabolic panel per protocol    CANCELED: CBC per protocol    CANCELED: EKG 12 lead per protocol       Felicie Morn, MD  [facility] Use [email or web address] to contact on call provider

## 2022-10-10 ENCOUNTER — Encounter (HOSPITAL_COMMUNITY): Payer: Self-pay | Admitting: Surgery

## 2022-11-01 ENCOUNTER — Other Ambulatory Visit: Payer: Self-pay

## 2022-11-01 ENCOUNTER — Observation Stay (HOSPITAL_BASED_OUTPATIENT_CLINIC_OR_DEPARTMENT_OTHER)
Admission: EM | Admit: 2022-11-01 | Discharge: 2022-11-02 | Disposition: A | Discharge status: Home / Self Care | Payer: Medicare PPO | Attending: Emergency Medicine | Admitting: Emergency Medicine

## 2022-11-01 ENCOUNTER — Encounter (HOSPITAL_COMMUNITY): Payer: Self-pay

## 2022-11-01 ENCOUNTER — Encounter (HOSPITAL_BASED_OUTPATIENT_CLINIC_OR_DEPARTMENT_OTHER): Payer: Self-pay

## 2022-11-01 ENCOUNTER — Emergency Department (HOSPITAL_BASED_OUTPATIENT_CLINIC_OR_DEPARTMENT_OTHER): Payer: Medicare PPO

## 2022-11-01 DIAGNOSIS — R109 Unspecified abdominal pain: Secondary | ICD-10-CM | POA: Diagnosis present

## 2022-11-01 DIAGNOSIS — R339 Retention of urine, unspecified: Secondary | ICD-10-CM | POA: Diagnosis not present

## 2022-11-01 DIAGNOSIS — I2510 Atherosclerotic heart disease of native coronary artery without angina pectoris: Secondary | ICD-10-CM | POA: Diagnosis not present

## 2022-11-01 DIAGNOSIS — D1800 Hemangioma unspecified site: Secondary | ICD-10-CM | POA: Diagnosis present

## 2022-11-01 DIAGNOSIS — E876 Hypokalemia: Secondary | ICD-10-CM | POA: Insufficient documentation

## 2022-11-01 DIAGNOSIS — E785 Hyperlipidemia, unspecified: Secondary | ICD-10-CM | POA: Diagnosis not present

## 2022-11-01 DIAGNOSIS — D1803 Hemangioma of intra-abdominal structures: Secondary | ICD-10-CM | POA: Diagnosis not present

## 2022-11-01 DIAGNOSIS — I10 Essential (primary) hypertension: Secondary | ICD-10-CM | POA: Diagnosis not present

## 2022-11-01 DIAGNOSIS — N1831 Chronic kidney disease, stage 3a: Secondary | ICD-10-CM | POA: Diagnosis not present

## 2022-11-01 DIAGNOSIS — N320 Bladder-neck obstruction: Secondary | ICD-10-CM

## 2022-11-01 DIAGNOSIS — Z79899 Other long term (current) drug therapy: Secondary | ICD-10-CM | POA: Insufficient documentation

## 2022-11-01 DIAGNOSIS — R319 Hematuria, unspecified: Secondary | ICD-10-CM | POA: Diagnosis not present

## 2022-11-01 DIAGNOSIS — N1330 Unspecified hydronephrosis: Secondary | ICD-10-CM | POA: Diagnosis not present

## 2022-11-01 DIAGNOSIS — K5790 Diverticulosis of intestine, part unspecified, without perforation or abscess without bleeding: Secondary | ICD-10-CM | POA: Diagnosis not present

## 2022-11-01 LAB — COMPREHENSIVE METABOLIC PANEL
ALT: 22 U/L (ref 0–44)
AST: 22 U/L (ref 15–41)
Albumin: 4.7 g/dL (ref 3.5–5.0)
Alkaline Phosphatase: 61 U/L (ref 38–126)
Anion gap: 12 (ref 5–15)
BUN: 14 mg/dL (ref 8–23)
CO2: 20 mmol/L — ABNORMAL LOW (ref 22–32)
Calcium: 9.6 mg/dL (ref 8.9–10.3)
Chloride: 104 mmol/L (ref 98–111)
Creatinine, Ser: 1.35 mg/dL — ABNORMAL HIGH (ref 0.61–1.24)
GFR, Estimated: 55 mL/min — ABNORMAL LOW (ref >60)
Glucose, Bld: 125 mg/dL — ABNORMAL HIGH (ref 70–99)
Potassium: 4.4 mmol/L (ref 3.5–5.1)
Sodium: 136 mmol/L (ref 135–145)
Total Bilirubin: 2 mg/dL — ABNORMAL HIGH (ref 0.3–1.2)
Total Protein: 7 g/dL (ref 6.5–8.1)

## 2022-11-01 LAB — BASIC METABOLIC PANEL
Anion gap: 4 — ABNORMAL LOW (ref 5–15)
BUN: 10 mg/dL (ref 8–23)
CO2: 18 mmol/L — ABNORMAL LOW (ref 22–32)
Calcium: 6.8 mg/dL — ABNORMAL LOW (ref 8.9–10.3)
Chloride: 117 mmol/L — ABNORMAL HIGH (ref 98–111)
Creatinine, Ser: 0.85 mg/dL (ref 0.61–1.24)
GFR, Estimated: >60 mL/min (ref >60)
Glucose, Bld: 85 mg/dL (ref 70–99)
Potassium: 3 mmol/L — ABNORMAL LOW (ref 3.5–5.1)
Sodium: 139 mmol/L (ref 135–145)

## 2022-11-01 LAB — URINALYSIS, ROUTINE W REFLEX MICROSCOPIC
Bilirubin Urine: NEGATIVE
Glucose, UA: NEGATIVE mg/dL
Ketones, ur: NEGATIVE mg/dL
Leukocytes,Ua: NEGATIVE
Nitrite: NEGATIVE
Protein, ur: NEGATIVE mg/dL
Specific Gravity, Urine: 1.015 (ref 1.005–1.030)
pH: 7 (ref 5.0–8.0)

## 2022-11-01 LAB — URINALYSIS, MICROSCOPIC (REFLEX): WBC, UA: NONE SEEN WBC/hpf (ref 0–5)

## 2022-11-01 LAB — CBC
HCT: 42.3 % (ref 39.0–52.0)
Hemoglobin: 14.9 g/dL (ref 13.0–17.0)
MCH: 32.1 pg (ref 26.0–34.0)
MCHC: 35.2 g/dL (ref 30.0–36.0)
MCV: 91.2 fL (ref 80.0–100.0)
Platelets: 220 10*3/uL (ref 150–400)
RBC: 4.64 MIL/uL (ref 4.22–5.81)
RDW: 12.3 % (ref 11.5–15.5)
WBC: 6.4 10*3/uL (ref 4.0–10.5)
nRBC: 0 % (ref 0.0–0.2)

## 2022-11-01 LAB — LIPASE, BLOOD: Lipase: 28 U/L (ref 11–51)

## 2022-11-01 MED ORDER — FINASTERIDE 5 MG PO TABS
5.0000 mg | ORAL_TABLET | Freq: Every day | ORAL | Status: DC
  Administered 2022-11-02: 5 mg via ORAL
  Filled 2022-11-01: qty 1

## 2022-11-01 MED ORDER — SENNOSIDES-DOCUSATE SODIUM 8.6-50 MG PO TABS
1.0000 | ORAL_TABLET | Freq: Every evening | ORAL | Status: DC | PRN
  Administered 2022-11-01: 1 via ORAL
  Filled 2022-11-01: qty 1

## 2022-11-01 MED ORDER — ACETAMINOPHEN 325 MG PO TABS
650.0000 mg | ORAL_TABLET | Freq: Four times a day (QID) | ORAL | Status: DC | PRN
  Administered 2022-11-01: 650 mg via ORAL
  Filled 2022-11-01: qty 2

## 2022-11-01 MED ORDER — FENTANYL CITRATE PF 50 MCG/ML IJ SOSY
50.0000 ug | PREFILLED_SYRINGE | Freq: Once | INTRAMUSCULAR | Status: AC
  Administered 2022-11-01: 50 ug via INTRAVENOUS
  Filled 2022-11-01: qty 1

## 2022-11-01 MED ORDER — ATORVASTATIN CALCIUM 10 MG PO TABS
10.0000 mg | ORAL_TABLET | Freq: Every evening | ORAL | Status: DC
  Administered 2022-11-01: 10 mg via ORAL
  Filled 2022-11-01: qty 1

## 2022-11-01 MED ORDER — ONDANSETRON HCL 4 MG PO TABS: 4.0000 mg | ORAL_TABLET | Freq: Four times a day (QID) | ORAL | Status: DC | PRN

## 2022-11-01 MED ORDER — ONDANSETRON HCL 4 MG/2ML IJ SOLN: 4.0000 mg | Freq: Four times a day (QID) | INTRAMUSCULAR | Status: DC | PRN

## 2022-11-01 MED ORDER — ACETAMINOPHEN 650 MG RE SUPP: 650.0000 mg | Freq: Four times a day (QID) | RECTAL | Status: DC | PRN

## 2022-11-01 MED ORDER — SODIUM CHLORIDE 0.9 % IV BOLUS
500.0000 mL | Freq: Once | INTRAVENOUS | Status: AC
  Administered 2022-11-01: 500 mL via INTRAVENOUS

## 2022-11-01 MED ORDER — IOHEXOL 300 MG/ML  SOLN
100.0000 mL | Freq: Once | INTRAMUSCULAR | Status: AC | PRN
  Administered 2022-11-01: 100 mL via INTRAVENOUS

## 2022-11-01 MED ORDER — ENOXAPARIN SODIUM 40 MG/0.4ML IJ SOSY
40.0000 mg | PREFILLED_SYRINGE | INTRAMUSCULAR | Status: DC
  Administered 2022-11-01: 40 mg via SUBCUTANEOUS
  Filled 2022-11-01: qty 0.4

## 2022-11-01 MED ORDER — POTASSIUM CHLORIDE 10 MEQ/100ML IV SOLN
10.0000 meq | Freq: Once | INTRAVENOUS | Status: AC
  Administered 2022-11-01: 10 meq via INTRAVENOUS
  Filled 2022-11-01: qty 100

## 2022-11-01 MED ORDER — POTASSIUM CHLORIDE CRYS ER 20 MEQ PO TBCR
40.0000 meq | EXTENDED_RELEASE_TABLET | Freq: Once | ORAL | Status: AC
  Administered 2022-11-01: 40 meq via ORAL
  Filled 2022-11-01: qty 2

## 2022-11-01 NOTE — Discharge Instructions (Signed)
At this time there does not appear to be the presence of an emergent medical condition, however there is always the potential for conditions to change. Please read and follow the below instructions.  Please return to the Emergency Department immediately for any new or worsening symptoms. Please be sure to follow up with your Primary Care Provider within one week regarding your visit today; please call their office to schedule an appointment even if you are feeling better for a follow-up visit. He is call the urologist office today to schedule follow-up appointment.  Please inform them that you had a Foley catheter placed in the ER and need follow-up soon.   Please read the additional information packets attached to your discharge summary.  Go to the nearest Emergency Department immediately if: You have fever or chills You have abdominal pain, nausea or vomiting. You have a tube that drains pee from the bladder and these things happen: The tube stops draining pee. The tube falls out.     You have any new/concerning or worsening of symptoms.  Do not take your medicine if  develop an itchy rash, swelling in your mouth or lips, or difficulty breathing; call 911 and seek immediate emergency medical attention if this occurs.  You may review your lab tests and imaging results in their entirety on your MyChart account.  Please discuss all results of fully with your primary care provider and other specialist at your follow-up visit.  Note: Portions of this text may have been transcribed using voice recognition software. Every effort was made to ensure accuracy; however, inadvertent computerized transcription errors may still be present.

## 2022-11-01 NOTE — ED Triage Notes (Signed)
Pt c/o LLQ abd pain onset this AM approx 8am "after coffee," radiation into back. Surgery approx 3wks ago for umbilical hernia, successful w no complications, normal bowel function after. LBM yesterday, unable to pass gas today. Hx BPH, reports "usual" difficulty urinating.

## 2022-11-01 NOTE — ED Notes (Signed)
Pt urine output 249m bladder scanner showed >9949m Foley inserted per EDP.

## 2022-11-01 NOTE — ED Provider Notes (Signed)
[facility]PART[facility] Provider Note   CSN: [id number] Arrival date & time: 11/01/22  1206     History  Chief Complaint  Patient presents with   Abdominal Pain    Luke Becker is a 76 y.o. male history includes CKD, BPH, diverticulitis, GERD, OA, hernia repair approximately 3 weeks ago.  Patient presented with his wife today for evaluation of periumbilical and left lower quadrant abdominal pain sudden onset this morning around 8 AM while he was drinking coffee, pain will radiate to his lower back, pain is sharp intermittent moderate intensity and without clear aggravating or alleviating factors.  He denies similar pain in the past.  He does report a history of BPH and occasional difficulty voiding he reports he is scheduled for a CT scan with his urologist in around 2 weeks.  Patient has fever, chills, fall, injury, numbness, tingling, chest pain, difficulty breathing, dysuria/hematuria or any additional concerns. HPI     Home Medications Prior to Admission medications   Medication Sig Start Date End Date Taking? Authorizing Provider  acetaminophen (TYLENOL) 500 MG tablet Take 1,000 mg by mouth every 6 (six) hours as needed for moderate pain or headache.    [provider]  albuterol (VENTOLIN HFA) 108 (90 Base) MCG/ACT inhaler Inhale 1-2 puffs into the lungs every 6 (six) hours as needed for wheezing or shortness of breath.    [provider]  atorvastatin (LIPITOR) 10 MG tablet Take 10 mg by mouth every evening.    [provider]  Creatine POWD Take 1 Scoop by mouth daily as needed (post workout (~3 times a week)).    [provider]  doxazosin (CARDURA) 4 MG tablet Take 4 mg by mouth at bedtime. Patient not taking: Reported on 10/08/2022    [provider]  finasteride (PROSCAR) 5 MG tablet Take 5 mg by mouth in the morning.    [provider]  Glutamine POWD Take 1 Scoop by mouth daily as needed  (post workout (~3 times a week)).    [provider]  Magnesium 500 MG TABS Take by mouth in the morning.    [provider]  Methylsulfonylmethane (MSM) 1000 MG TABS Take 1,000 mg by mouth in the morning.    [provider]  Protein POWD Take 1 Scoop by mouth daily as needed (post workout (~3 times a week)).    [provider]  silodosin (RAPAFLO) 8 MG CAPS capsule Take 8 mg by mouth daily. Pt takes at evening    [provider]  traMADol (ULTRAM) 50 MG tablet Take 1 tablet (50 mg total) by mouth every 6 (six) hours as needed for severe pain. 10/09/22 10/09/23  Stechschulte, Nickola Major, MD      Allergies    Azithromycin    Review of Systems   Review of Systems Ten systems are reviewed and are negative for acute change except as noted in the HPI  Physical Exam Updated Vital Signs BP (!) 165/88   Pulse 61   Temp 98.4 F (36.9 C) (Oral)   Resp 16   SpO2 98%  Physical Exam Constitutional:      General: He is not in acute distress.    Appearance: Normal appearance. He is well-developed. He is not ill-appearing or diaphoretic.  HENT:     Head: Normocephalic and atraumatic.  Eyes:     General: Vision grossly intact. Gaze aligned appropriately.     Pupils: Pupils are equal, round, and reactive to  light.  Neck:     Trachea: Trachea and phonation normal.  Pulmonary:     Effort: Pulmonary effort is normal. No respiratory distress.  Abdominal:     General: There is no distension.     Palpations: Abdomen is soft.     Tenderness: There is generalized abdominal tenderness and tenderness in the periumbilical area, suprapubic area and left lower quadrant. There is no guarding or rebound.     Comments: Surgical scar appears to be well-healing.  No erythema or dehiscence.  Musculoskeletal:        General: Normal range of motion.     Cervical back: Normal range of motion.  Skin:    General: Skin is warm and dry.  Neurological:     Mental Status: He is  alert.     GCS: GCS eye subscore is 4. GCS verbal subscore is 5. GCS motor subscore is 6.     Comments: Speech is clear and goal oriented, follows commands Major Cranial nerves without deficit, no facial droop Moves extremities without ataxia, coordination intact  Psychiatric:        Behavior: Behavior normal.     ED Results / Procedures / Treatments   Labs (all labs ordered are listed, but only abnormal results are displayed) Labs Reviewed  COMPREHENSIVE METABOLIC PANEL - Abnormal; Notable for the following components:      Result Value   CO2 20 (*)    Glucose, Bld 125 (*)    Creatinine, Ser 1.35 (*)    Total Bilirubin 2.0 (*)    GFR, Estimated 55 (*)    All other components within normal limits  URINALYSIS, ROUTINE W REFLEX MICROSCOPIC - Abnormal; Notable for the following components:   Hgb urine dipstick TRACE (*)    All other components within normal limits  URINALYSIS, MICROSCOPIC (REFLEX) - Abnormal; Notable for the following components:   Bacteria, UA RARE (*)    All other components within normal limits  BASIC METABOLIC PANEL - Abnormal; Notable for the following components:   Potassium 3.0 (*)    Chloride 117 (*)    CO2 18 (*)    Calcium 6.8 (*)    Anion gap 4 (*)    All other components within normal limits  LIPASE, BLOOD  CBC    EKG None  Radiology CT ABDOMEN PELVIS W CONTRAST  Result Date: 11/01/2022 CLINICAL DATA:  Abdominal pain EXAM: CT ABDOMEN AND PELVIS WITH CONTRAST TECHNIQUE: Multidetector CT imaging of the abdomen and pelvis was performed using the standard protocol following bolus administration of intravenous contrast. RADIATION DOSE REDUCTION: This exam was performed according to the departmental dose-optimization program which includes automated exposure control, adjustment of the mA and/or kV according to patient size and/or use of iterative reconstruction technique. CONTRAST:  133m OMNIPAQUE IOHEXOL 300 MG/ML  SOLN COMPARISON:  None Available.  FINDINGS: Lower chest: No acute abnormality.  Coronary artery atherosclerosis. Hepatobiliary: 1.6 cm area of focal hyperenhancement in the right hepatic lobe near the porta hepatis (series 2, image 21) which becomes isoattenuating to liver parenchyma on delayed phase imaging, favored to represent a flash filling hemangioma. There are a few small cysts within the right hepatic lobe. Unremarkable gallbladder. No hyperdense gallstone. No biliary dilatation. Pancreas: Unremarkable. No pancreatic ductal dilatation or surrounding inflammatory changes. Spleen: Normal in size without focal abnormality. Adrenals/Urinary Tract: Unremarkable adrenal glands. Severe bilateral hydroureteronephrosis without renal or ureteral calculus. No abnormal renal cortical thinning to suggest a long-standing obstruction. Massive urinary bladder distension. Bladder measures  approximately 21.6 x 14.7 x 19.0 cm (volume = 3160 cm^3). The base of the bladder is obscured by excessive streak artifact from patient's hip prostheses. No focal bladder wall thickening is visualized. Stomach/Bowel: Stomach is within normal limits. Appendix appears normal (series 2, image 60). Colonic diverticulosis. No evidence of bowel wall thickening, distention, or inflammatory changes. Vascular/Lymphatic: Aortic atherosclerosis. No enlarged abdominal or pelvic lymph nodes. Reproductive: Enlarged prostate gland, which is largely obscured by streak artifact. Other: No free fluid. No abdominopelvic fluid collection. No pneumoperitoneum. No abdominal wall hernia. Musculoskeletal: Postsurgical changes from bilateral total hip arthroplasties without apparent complication. 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 No acute bony findings. No suspicious bone lesion. IMPRESSION: 1. Massive urinary bladder distension and severe bilateral  hydroureteronephrosis. Findings are suggestive of bladder outlet obstruction. 2. Enlarged prostate gland. 3. Colonic diverticulosis without evidence of acute diverticulitis. 4. 1.6 cm area of focal hyperenhancement in the right hepatic lobe near the porta hepatis, favored to represent a flash filling hemangioma. Nonemergent abdominal MRI with contrast can be performed to confirm. 5. Coronary artery atherosclerosis (ICD10-I70.0). Electronically Signed   By: Davina Poke D.O.   On: 11/01/2022 14:20    Procedures Procedures    Medications Ordered in ED Medications  potassium chloride 10 mEq in 100 mL IVPB (has no administration in time range)  potassium chloride SA (KLOR-CON M) CR tablet 40 mEq (has no administration in time range)  sodium chloride 0.9 % bolus 500 mL (0 mLs Intravenous Stopped 11/01/22 1419)  fentaNYL (SUBLIMAZE) injection 50 mcg (50 mcg Intravenous Given 11/01/22 1336)  iohexol (OMNIPAQUE) 300 MG/ML solution 100 mL (100 mLs Intravenous Contrast Given 11/01/22 1330)  fentaNYL (SUBLIMAZE) injection 50 mcg (50 mcg Intravenous Given 11/01/22 1714)    ED Course/ Medical Decision Making/ A&P Clinical Course as of 11/01/22 1845  Thu Nov 01, 2022  1416 I personally reviewed and interpreted patient CT abdomen pelvis he has significant bladder distention with significant bilateral hydronephrosis.  RN asked to discontinue fluids and obtain bladder scan.  Patient does report history of urinary retention in the past and is actually scheduled to see a urologist for this later on in December.  He reports he is able to urinate he will try to use the urinal at bedside and then we will get a postvoid residual. [BM]  1448 Dr. Junious Silk [BM]  1735 Dr. Junious Silk [BM]  1844 Dr. Flossie Buffy [BM]    Clinical Course User Index [BM] Deliah Boston, PA-C                           Medical Decision Making 76 year old male presented for acute onset abdominal pain this morning around 8 AM when he was  drinking coffee.  He did have a recent surgery for hernia.  On initial examination he is in no acute distress his abdomen is diffusely tender his incision sites appear to be well-healing.  IV fluids along with pain medication ordered.  Risks and benefits of CT imaging were discussed with patient and his wife they elected to proceed with CT imaging.  Differential includes but not limited to appendicitis, diverticulitis, cholecystitis, SBO, perforation, postsurgical infection/abscess. ----  Amount and/or Complexity of Data Reviewed Labs: ordered.    Details:  Urinalysis shows trace hemoglobin, rare bacteria.  Doubt urinary tract infection. Lipase within normal limits, doubt pancreatitis. CMP  shows mild elevation of creatinine 1.35 appears similar to prior.  BUN within normal limits.  No evidence for significant AKI.  No emergent lecture derangement, LFT elevations or gap.  Total bilirubin slightly elevated at 2.0. CBC without leukocytosis to suggest infectious process.  No anemia or thrombocytopenia.  Radiology: ordered and independent interpretation performed. Decision-making details documented in ED Course.    Details:  I personally reviewed and interpreted patient CT abdomen pelvis he has significant bladder distention with significant bilateral hydronephrosis.  RN asked to discontinue fluids and obtain bladder scan.  Patient does report history of urinary retention in the past and is actually scheduled to see a urologist for this later on in December.  He reports he is able to urinate he will try to use the urinal at bedside and then we will get a postvoid residual.  Discussion of management or test interpretation with external provider(s):  Case discussed with on-call urologist Dr. Junious Silk.  Dr. Junious Silk recommended leaving Foley catheter in place and follow-up in their office in the next week.  Continue monitoring urine output here in the ER, after an hour or so empty of the back completely and  then recheck how much patient voids over the following 1 hour.  Patient should be encouraged to drink plenty of water and use Gatorade to help avoid dehydration.  Around 2 hours after Foley was initially placed the bag was emptied.  He then produced around 300 mL of additional urine afterwards.  I recontacted Dr. Junious Silk who recommended we could obtain a repeat BMP if electrolytes looked stable patient could be discharged as planned above.   Risk Prescription drug management. Decision regarding hospitalization. Risk Details: Repeat BMP showed hypokalemia of 3.0 this was decreased from 4.4 on ED arrival a few hours ago.  Given this drop in patient's continued urine output decision was made to admit patient for observation and repletion.  Case was discussed with Dr. Laverta Baltimore who agrees with plan.  Patient and his wife were updated and they are both in agreement as well.    Case was discussed with hospitalist Dr. Flossie Buffy who has accepted patient for admission.    Note: Portions of this report may have been transcribed using voice recognition software. Every effort was made to ensure accuracy; however, inadvertent computerized transcription errors may still be present.         Final Clinical Impression(s) / ED Diagnoses Final diagnoses:  Urinary retention  Hypokalemia    Rx / DC Orders ED Discharge Orders     None         Gari Crown 11/01/22 1846    Margette Fast, MD 11/02/22 1013

## 2022-11-01 NOTE — Progress Notes (Signed)
Plan of Care Note for accepted transfer   Patient: Luke Becker MRN: [id number]   [place]: 11/01/2022  Facility requesting transfer: [facility] ED Requesting Provider: Nuala Alpha, PA Reason for transfer: Urinary retention/Hypokalemia Facility course: 76 yo with BPH, HLD who presented to ED with  acute LLQ abdominal pain. Had recent hernia repair so CT A/P was obtained which revealed massive urinary bladder distension and severe bilateral hydroureteronephrosis.   Foley was then placed with 3L of output.   ED PA discussed with urology Dr. Junious Silk who recommended continual monitor with foley and to follow electrolytes.   Creatinine improved from 1.35 to 0.85. However potassium downward trended from 4.4 to 3 which was repleted in ED with both IV and oral potassium.  He also continues to have output from foley so consult for observation overnight was requested to monitor both his electrolyte and UOP.        Plan of care: The patient is accepted for admission to [place]  unit, at [facility]..  ED PA to continue care of pt while he remains in ED.  Author: Orene Desanctis, DO 11/01/2022  Check [email or web address] for on-call coverage.  Nursing staff, Please call [place] number on Amion as soon as patient's arrival, so appropriate admitting provider can evaluate the pt.

## 2022-11-01 NOTE — ED Notes (Signed)
Per Edp stop fluids, have pt empty bladder, use bladder scanner to check residual urine

## 2022-11-01 NOTE — H&P (Signed)
PCP:   Kristen Loader, FNP   Chief Complaint:    HPI: This is a 76 year old male with past medical history of BPH.  This morning around 8 AM he got up and he felt fine.  By the time he had his coffee his abdomen below his bellybutton began to feel sore.  Quickly became extreme pain, he ranked his pain between 8-10 out of 10 most of the time.  After a while it moved to his back.  He thought it was constipation, he did a suppository, Colace, then smooth move tea.  The pain became progressively worse and he began having dry heaving.  911 was called, he was taken to the ER.  In the [facility], imaging showed a massively enlarged bladder.  A Foley was placed and 3,500 cc urine was drained.  He was transferred to [facility], ER  Review of Systems:  The patient denies anorexia, fever, weight loss,, vision loss, decreased hearing, hoarseness, chest pain, syncope, dyspnea on exertion, peripheral edema, balance deficits, hemoptysis, abdominal pain, melena, hematochezia, severe indigestion/heartburn, hematuria, incontinence, genital sores, muscle weakness, suspicious skin lesions, transient blindness, difficulty walking, depression, unusual weight change, abnormal bleeding, enlarged lymph nodes, angioedema, and breast masses.  Positives: Abdominal pain, nausea, vomiting, dry heaving, urinary retention   Past Medical History: Past Medical History:  Diagnosis Date   BPH (benign prostatic hyperplasia)    Chronic kidney disease    urine retention due to BPH   Diverticulosis    GERD (gastroesophageal reflux disease)    History of ankle fracture    OA (osteoarthritis)    Sinus bradycardia    Past Surgical History:  Procedure Laterality Date   CARPAL TUNNEL RELEASE Bilateral 2019   COLONOSCOPY     TONSILLECTOMY     as a child   TOTAL HIP ARTHROPLASTY Right 12/23/2017   Procedure: RIGHT TOTAL HIP ARTHROPLASTY ANTERIOR APPROACH;  Surgeon: [name], MD;  Location: [facility];   Service: Orthopedics;  Laterality: Right;   TOTAL HIP ARTHROPLASTY Left 12/17/2018   Procedure: TOTAL HIP ARTHROPLASTY ANTERIOR APPROACH;  Surgeon: [name], MD;  Location: [facility];  Service: Orthopedics;  Laterality: Left;  [id number]   UMBILICAL HERNIA REPAIR N/A 10/09/2022   Procedure: UMBILICAL HERNIA REPAIR, POSSIBLY WITH MESH;  Surgeon: Felicie Morn, MD;  Location: [facility];  Service: General;  Laterality: N/A;    Medications: Prior to Admission medications   Medication Sig Start Date End Date Taking? Authorizing Provider  acetaminophen (TYLENOL) 500 MG tablet Take 1,000 mg by mouth every 6 (six) hours as needed for moderate pain or headache.    [provider]  albuterol (VENTOLIN HFA) 108 (90 Base) MCG/ACT inhaler Inhale 1-2 puffs into the lungs every 6 (six) hours as needed for wheezing or shortness of breath.    [provider]  atorvastatin (LIPITOR) 10 MG tablet Take 10 mg by mouth every evening.    [provider]  Creatine POWD Take 1 Scoop by mouth daily as needed (post workout (~3 times a week)).    [provider]  doxazosin (CARDURA) 4 MG tablet Take 4 mg by mouth at bedtime. Patient not taking: Reported on 10/08/2022    [provider]  finasteride (PROSCAR) 5 MG tablet Take 5 mg by mouth in the morning.    [provider]  Glutamine POWD Take 1 Scoop by mouth daily as needed (post workout (~3 times a week)).    [provider]  Magnesium  500 MG TABS Take by mouth in the morning.    [provider]  Methylsulfonylmethane (MSM) 1000 MG TABS Take 1,000 mg by mouth in the morning.    [provider]  Protein POWD Take 1 Scoop by mouth daily as needed (post workout (~3 times a week)).    [provider]  silodosin (RAPAFLO) 8 MG CAPS capsule Take 8 mg by mouth daily. Pt takes at evening    [provider]  traMADol (ULTRAM) 50 MG tablet Take 1 tablet (50 mg total) by mouth  every 6 (six) hours as needed for severe pain. 10/09/22 10/09/23  Stechschulte, Nickola Major, MD    Allergies:   Allergies  Allergen Reactions   Azithromycin Other (See Comments)    Upset stomach    Social History:  reports that he has never smoked. He has never used smokeless tobacco. He reports that he does not currently use alcohol after a past usage of about 2.0 standard drinks of alcohol per week. He reports that he does not use drugs.  Family History: History reviewed. No pertinent family history.  Physical Exam: Vitals:   11/01/22 1730 11/01/22 1800 11/01/22 1930 11/01/22 2037  BP: (!) 142/87 (!) 165/88 (!) 154/97 (!) 169/94  Pulse: (!) 57 61 (!) 59 65  Resp:  '16 14 12  '$ Temp:    98.4 F (36.9 C)  TempSrc:    Oral  SpO2: 97% 98% 99% 98%    General:  Alert and oriented times three, well developed and nourished, no acute distress Eyes: PERRLA, pink conjunctiva, no scleral icterus ENT: Moist oral mucosa, neck supple, no thyromegaly Lungs: clear to ascultation, no wheeze, no crackles, no use of accessory muscles Cardiovascular: regular rate and rhythm, no regurgitation, no gallops, no murmurs. No carotid bruits, no JVD Abdomen: soft, positive BS, non-tender, non-distended, no organomegaly, not an acute abdomen GU: not examined Neuro: CN II - XII grossly intact, sensation intact Musculoskeletal: strength 5/5 all extremities, no clubbing, cyanosis or edema Skin: no rash, no subcutaneous crepitation, no decubitus Psych: appropriate patient   Labs on Admission:  Recent Labs    11/01/22 1217 11/01/22 1750  NA 136 139  K 4.4 3.0*  CL 104 117*  CO2 20* 18*  GLUCOSE 125* 85  BUN 14 10  CREATININE 1.35* 0.85  CALCIUM 9.6 6.8*   Recent Labs    11/01/22 1217  AST 22  ALT 22  ALKPHOS 61  BILITOT 2.0*  PROT 7.0  ALBUMIN 4.7   Recent Labs    11/01/22 1217  LIPASE 28   Recent Labs    11/01/22 1217  WBC 6.4  HGB 14.9  HCT 42.3  MCV 91.2  PLT 220      Radiological Exams on Admission: CT ABDOMEN PELVIS W CONTRAST  Result Date: 11/01/2022 CLINICAL DATA:  Abdominal pain EXAM: CT ABDOMEN AND PELVIS WITH CONTRAST TECHNIQUE: Multidetector CT imaging of the abdomen and pelvis was performed using the standard protocol following bolus administration of intravenous contrast. RADIATION DOSE REDUCTION: This exam was performed according to the departmental dose-optimization program which includes automated exposure control, adjustment of the mA and/or kV according to patient size and/or use of iterative reconstruction technique. CONTRAST:  139m OMNIPAQUE IOHEXOL 300 MG/ML  SOLN COMPARISON:  None Available. FINDINGS: Lower chest: No acute abnormality.  Coronary artery atherosclerosis. Hepatobiliary: 1.6 cm area of focal hyperenhancement in the right hepatic lobe near the porta hepatis (series 2, image 21) which becomes isoattenuating to liver parenchyma on  delayed phase imaging, favored to represent a flash filling hemangioma. There are a few small cysts within the right hepatic lobe. Unremarkable gallbladder. No hyperdense gallstone. No biliary dilatation. Pancreas: Unremarkable. No pancreatic ductal dilatation or surrounding inflammatory changes. Spleen: Normal in size without focal abnormality. Adrenals/Urinary Tract: Unremarkable adrenal glands. Severe bilateral hydroureteronephrosis without renal or ureteral calculus. No abnormal renal cortical thinning to suggest a long-standing obstruction. Massive urinary bladder distension. Bladder measures approximately 21.6 x 14.7 x 19.0 cm (volume = 3160 cm^3). The base of the bladder is obscured by excessive streak artifact from patient's hip prostheses. No focal bladder wall thickening is visualized. Stomach/Bowel: Stomach is within normal limits. Appendix appears normal (series 2, image 60). Colonic diverticulosis. No evidence of bowel wall thickening, distention, or inflammatory changes. Vascular/Lymphatic: Aortic  atherosclerosis. No enlarged abdominal or pelvic lymph nodes. Reproductive: Enlarged prostate gland, which is largely obscured by streak artifact. Other: No free fluid. No abdominopelvic fluid collection. No pneumoperitoneum. No abdominal wall hernia. Musculoskeletal: Postsurgical changes from bilateral total hip arthroplasties without apparent complication. 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 No acute bony findings. No suspicious bone lesion. IMPRESSION: 1. Massive urinary bladder distension and severe bilateral hydroureteronephrosis. Findings are suggestive of bladder outlet obstruction. 2. Enlarged prostate gland. 3. Colonic diverticulosis without evidence of acute diverticulitis. 4. 1.6 cm area of focal hyperenhancement in the right hepatic lobe near the porta hepatis, favored to represent a flash filling hemangioma. Nonemergent abdominal MRI with contrast can be performed to confirm. 5. Coronary artery atherosclerosis (ICD10-I70.0). Electronically Signed   By: Davina Poke D.O.   On: 11/01/2022 14:20    Assessment/Plan Present on Admission:  Urinary retention  Bladder outlet obstruction/ Hydroureteronephrosis -Admit to med telemetry -Maintain Foley -Patient with good urine output, mildly blood-tinged -Strict I's and O's -Urology consulted and aware -Patient with enlarged prostate,?  Could this be the cause of his obstruction -Currently no evidence of infection clinically nor in the UA   Hypokalemia -Repleted IV, BMP in a.m.  AKI -IV fluid hydration, BMP in a.m.   Hemangioma -Nonemergent MRI per a.m. team  Dyslipidemia -Statin resumed   Lannette Avellino 11/01/2022, 8:53 PM

## 2022-11-02 ENCOUNTER — Emergency Department (HOSPITAL_BASED_OUTPATIENT_CLINIC_OR_DEPARTMENT_OTHER)
Admission: EM | Admit: 2022-11-02 | Discharge: 2022-11-02 | Discharge status: Absent without leave | Payer: Medicare PPO | Source: Home / Self Care

## 2022-11-02 DIAGNOSIS — N320 Bladder-neck obstruction: Secondary | ICD-10-CM | POA: Diagnosis not present

## 2022-11-02 LAB — BASIC METABOLIC PANEL
Anion gap: 7 (ref 5–15)
BUN: 13 mg/dL (ref 8–23)
CO2: 25 mmol/L (ref 22–32)
Calcium: 8.9 mg/dL (ref 8.9–10.3)
Chloride: 108 mmol/L (ref 98–111)
Creatinine, Ser: 1.32 mg/dL — ABNORMAL HIGH (ref 0.61–1.24)
GFR, Estimated: 57 mL/min — ABNORMAL LOW (ref >60)
Glucose, Bld: 96 mg/dL (ref 70–99)
Potassium: 3.9 mmol/L (ref 3.5–5.1)
Sodium: 140 mmol/L (ref 135–145)

## 2022-11-02 LAB — CBC WITH DIFFERENTIAL/PLATELET
Abs Immature Granulocytes: 0.06 10*3/uL (ref 0.00–0.07)
Basophils Absolute: 0.1 10*3/uL (ref 0.0–0.1)
Basophils Relative: 1 %
Eosinophils Absolute: 0.1 10*3/uL (ref 0.0–0.5)
Eosinophils Relative: 1 %
HCT: 42.2 % (ref 39.0–52.0)
Hemoglobin: 14.5 g/dL (ref 13.0–17.0)
Immature Granulocytes: 1 %
Lymphocytes Relative: 26 %
Lymphs Abs: 1.6 10*3/uL (ref 0.7–4.0)
MCH: 32.1 pg (ref 26.0–34.0)
MCHC: 34.4 g/dL (ref 30.0–36.0)
MCV: 93.4 fL (ref 80.0–100.0)
Monocytes Absolute: 1 10*3/uL (ref 0.1–1.0)
Monocytes Relative: 17 %
Neutro Abs: 3.3 10*3/uL (ref 1.7–7.7)
Neutrophils Relative %: 54 %
Platelets: 219 10*3/uL (ref 150–400)
RBC: 4.52 MIL/uL (ref 4.22–5.81)
RDW: 12.2 % (ref 11.5–15.5)
WBC: 6.1 10*3/uL (ref 4.0–10.5)
nRBC: 0 % (ref 0.0–0.2)

## 2022-11-02 NOTE — Discharge Summary (Signed)
Physician Discharge Summary  [name] PPJ:[id number] DOB: Aug 30, 1946 DOA: 11/01/2022  PCP: Kristen Loader, FNP  Admit date: 11/01/2022 Discharge date: 11/02/2022  Admitted From: Home Discharge disposition: Home  Recommendations at discharge:  Discharged with Foley catheter.  Continue to monitor for the color of urine.  If it gets deep red or clots to cause retention, recommend to go to ED. Follow-up with urology as an outpatient Follow-up with PCP as an outpatient for a nonemergent MRI for liver hemangioma.  Brief narrative: Luke Becker is a 76 y.o. male with PMH significant for BPH, CKD, diverticulosis, GERD, osteoarthritis, sinus bradycardia who had a successful umbilical hernia repair about 3 weeks ago. Patient has a history of BPH and has been using Flomax and Rapaflo for several years.  He used to see urologist Dr. Milford Cage in the past but has not seen lately.  Patient reports that several days ago, he noted to have swollen abdomen.  He thought that he was gaining weight and started to cut back on his food.  He said he had regular bowel movement with and also was able to urinate in the interval. 11/30, patient had severe left lower quadrant abdominal pain radiating to the back.  It quickly became extremely painful 8-10/10 in severity.  EMS brought him to ED.   In the ED, patient was afebrile, hemodynamically stable.   Initial labs with creatinine elevated 1.35 CT abdomen pelvis showed massive urinary bladder distension and severe bilateral hydroureteronephrosis, enlarged prostate gland. Findings suggestive of bladder outlet obstruction. A Foley catheter was placed and 3500 mL of urine was drained. EDP discussed with urology Dr. Junious Silk who recommended to maintain Foley and monitor renal function. Admitted to hospitalist service.  Subjective: Patient was seen and examined this morning.  Pleasant elderly Caucasian male.  Propped up in bed.  Not in distress.   Strawberry lemonade color urine noted in Foley catheter and Urobag. 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  Chart reviewed. Remains afebrile, slightly bradycardic to 50s overnight, breathing on room air Labs this morning with creatinine at 1.32  Hospital course: Acute urinary retention History of BPH Presented with progressively worsening abdominal distention for few weeks and severe abdominal pain on the day of presentation. Imaging with severe bilateral hydronephrosis massive urinary bladder distention due to BPH. Foley catheter inserted.  More than 3 L of urine drained. Discharge with Foley catheter in place To follow-up with urology as an outpatient for voiding trial  Hematuria Patient has strawberry lemonade color urine in Foley catheter today.  Mild hematuria likely because of rapid bladder decompression.  Expected to stop on its own.  Patient is not on any blood thinner or NSAIDs.  I have instructed the patient and his wife to watch the color of urine.  If it starts to get deep red or clots to cause retention, patient is to come back to ED.  I offered patient an option to stay overnight to monitor inpatient.  He feels comfortable going home and self monitor.  CKD 3a Creatinine remains stable at baseline. Recent Labs    10/08/22 1303 11/01/22 1217 11/01/22 1750 11/02/22 0446  BUN '19 14 10 13  '$ CREATININE 1.44* 1.35* 0.85 1.32*   Hypokalemia Potassium low at 3 on admission.  Improved with replacement Recent Labs  Lab 11/01/22 1217 11/01/22 1750 11/02/22 0446  K 4.4 3.0* 3.9   Liver hemangioma CT abdomen showed 1.6 cm area of focal hyperenhancement in the right hepatic lobe near  the porta hepatis, favored to represent a flash filling hemangioma. Radiology recommended nonemergent MRI  Follow-up with PCP as an outpatient.     Coronary artery atherosclerosis Dyslipidemia Statin continue  Colonic diverticulosis Recommend good bowel regimen  Wounds:  - Incision  (Closed) 12/23/17 Hip Right (Active)  Date First Assessed/Time First Assessed: 12/23/17 1138   Location: Hip  Location Orientation: Right    Assessments 12/23/2017 12:05 PM 12/25/2017  7:21 AM  Dressing Type Silver hydrofiber Silicone dressing  Dressing Clean;Dry;Intact Clean;Dry;Intact  Site / Wound Assessment Dressing in place / Unable to assess Dressing in place / Unable to assess  Drainage Amount None None  Treatment Ice applied --     No associated orders.     Incision (Closed) 12/17/18 Hip Left (Active)  Date First Assessed/Time First Assessed: 12/17/18 1250   Location: Hip  Location Orientation: Left    Assessments 12/17/2018  1:55 PM 12/18/2018  8:29 AM  Dressing Type Silicone dressing Silicone dressing  Dressing Clean;Dry;Intact Clean;Dry;Intact  Site / Wound Assessment Dry Dressing in place / Unable to assess  Drainage Amount None None  Treatment -- Ice applied     No associated orders.     Incision (Closed) 49/70/26 Umbilicus (Active)  Date First Assessed/Time First Assessed: 10/09/22 1027   Location: Umbilicus  Present on Admission: No    Assessments 10/09/2022 10:27 AM 10/09/2022 11:45 AM  Dressing Type Liquid skin adhesive Liquid skin adhesive  Dressing -- Clean, Dry, Intact  Site / Wound Assessment -- Clean;Dry  Margins -- Attached edges (approximated)  Closure -- Skin glue  Drainage Amount -- None     No associated orders.    Discharge Exam:   Vitals:   11/02/22 0114 11/02/22 0523 11/02/22 0953 11/02/22 1000  BP: 120/69 133/82 126/83   Pulse: (!) 57 (!) 55 67   Resp: '16 16 16   '$ Temp: 98.2 F (36.8 C) 98.1 F (36.7 C) 98.2 F (36.8 C)   TempSrc: Oral  Oral   SpO2: 97% 97% 97%   Weight:    79.4 kg  Height:    '5\' 8"'$  (1.727 m)    Body mass index is 26.62 kg/m.  General exam: Pleasant, elderly Caucasian male.  Not in physical distress.  Foley catheter with light pink urine Skin: No rashes, lesions or ulcers. HEENT: Atraumatic, normocephalic, no obvious  bleeding Lungs: Clear to auscultation bilaterally CVS: Regular rate and rhythm, no murmur GI/Abd soft, nontender, nondistended, bowel sound present CNS: Alert, awake, oriented x 3 Psychiatry: Mood appropriate Extremities: No pedal edema, no calf tenderness  Follow ups:    Follow-up Information     [facility] .   Specialty: Emergency Medicine Why: Return as needed for new or worsening symptoms Contact information: [place] [phone] [address]. Call today.   Contact information: [place]        Kristen Loader, FNP Follow up.   Specialty: Family Medicine Contact information: [place] [address] [phone]                 Discharge Instructions:   Discharge Instructions     Call MD for:  difficulty breathing, headache or visual disturbances   Complete by: As directed    Call MD for:  extreme fatigue   Complete by: As directed  Call MD for:  hives   Complete by: As directed    Call MD for:  persistant dizziness or light-headedness   Complete by: As directed    Call MD for:  persistant nausea and vomiting   Complete by: As directed    Call MD for:  severe uncontrolled pain   Complete by: As directed    Call MD for:  temperature >100.4   Complete by: As directed    Diet general   Complete by: As directed    Discharge instructions   Complete by: As directed    Recommendations at discharge:   Discharged with Foley catheter.  Continue to monitor for the color of urine.  If it gets deep red or clots to cause retention, recommend to go to ED.  Follow-up with urology as an outpatient  Follow-up with PCP as an outpatient for a nonemergent MRI for liver hemangioma.  General discharge instructions: Follow with Primary MD Kristen Loader, FNP in 7 days   Please request your PCP  to go over your hospital tests, procedures, radiology results at the follow up. Please get your medicines reviewed and adjusted.  Your PCP may decide to repeat certain labs or tests as needed. Do not drive, operate heavy machinery, perform activities at heights, swimming or participation in water activities or provide baby sitting services if your were admitted for syncope or siezures until you have seen by Primary MD or a Neurologist and advised to do so again. [place] Controlled Substance Reporting System database was reviewed. Do not drive, operate heavy machinery, perform activities at heights, swim, participate in water activities or provide baby-sitting services while on medications for pain, sleep and mood until your outpatient physician has reevaluated you and advised to do so again.  You are strongly recommended to comply with the dose, frequency and duration of prescribed medications. Activity: As tolerated with Full fall precautions use walker/cane & assistance as needed Avoid using any recreational substances like cigarette, tobacco, alcohol, or non-prescribed drug. If you experience worsening of your admission symptoms, develop shortness of breath, life threatening emergency, suicidal or homicidal thoughts you must seek medical attention immediately by calling 911 or calling your MD immediately  if symptoms less severe. You must read complete instructions/literature along with all the possible adverse reactions/side effects for all the medicines you take and that have been prescribed to you. Take any new medicine only after you have completely understood and accepted all the possible adverse reactions/side effects.  Wear Seat belts while driving. You were cared for by a hospitalist during your hospital stay. If you have any questions about your discharge medications or the care you received while you were in the hospital after you are discharged, you can call  the unit and ask to speak with the hospitalist or the covering physician. Once you are discharged, your primary care physician will handle any further medical issues. Please note that NO REFILLS for any discharge medications will be authorized once you are discharged, as it is imperative that you return to your primary care physician (or establish a relationship with a primary care physician if you do not have one).   Increase activity slowly   Complete by: As directed        Discharge Medications:   Allergies as of 11/02/2022       Reactions   Azithromycin Other (See Comments)   Upset stomach        Medication List     TAKE  these medications    acetaminophen 500 MG tablet Commonly known as: TYLENOL Take 1,000 mg by mouth every 6 (six) hours as needed for moderate pain or headache.   albuterol 108 (90 Base) MCG/ACT inhaler Commonly known as: VENTOLIN HFA Inhale 1-2 puffs into the lungs every 6 (six) hours as needed for wheezing or shortness of breath.   atorvastatin 10 MG tablet Commonly known as: LIPITOR Take 10 mg by mouth every evening.   Creatine Powd Take 1 Scoop by mouth daily as needed (post workout (~3 times a week)).   docusate sodium 100 MG capsule Commonly known as: COLACE Take 100 mg by mouth daily as needed for mild constipation.   finasteride 5 MG tablet Commonly known as: PROSCAR Take 5 mg by mouth in the morning.   Glutamine Powd Take 1 Scoop by mouth daily as needed (post workout (~3 times a week)).   Magnesium 500 MG Tabs Take by mouth in the morning.   MSM 1000 MG Tabs Take 1,000 mg by mouth in the morning.   Protein Powd Take 1 Scoop by mouth daily as needed (post workout (~3 times a week)).   psyllium 58.6 % powder Commonly known as: METAMUCIL Take 1 packet by mouth daily.   silodosin 8 MG Caps capsule Commonly known as: RAPAFLO Take 8 mg by mouth daily. Pt takes at evening   traMADol 50 MG tablet Commonly known as: Ultram Take 1  tablet (50 mg total) by mouth every 6 (six) hours as needed for severe pain.         The results of significant diagnostics from this hospitalization (including imaging, microbiology, ancillary and laboratory) are listed below for reference.    Procedures and Diagnostic Studies:   CT ABDOMEN PELVIS W CONTRAST  Result Date: 11/01/2022 CLINICAL DATA:  Abdominal pain EXAM: CT ABDOMEN AND PELVIS WITH CONTRAST TECHNIQUE: Multidetector CT imaging of the abdomen and pelvis was performed using the standard protocol following bolus administration of intravenous contrast. RADIATION DOSE REDUCTION: This exam was performed according to the departmental dose-optimization program which includes automated exposure control, adjustment of the mA and/or kV according to patient size and/or use of iterative reconstruction technique. CONTRAST:  161m OMNIPAQUE IOHEXOL 300 MG/ML  SOLN COMPARISON:  None Available. FINDINGS: Lower chest: No acute abnormality.  Coronary artery atherosclerosis. Hepatobiliary: 1.6 cm area of focal hyperenhancement in the right hepatic lobe near the porta hepatis (series 2, image 21) which becomes isoattenuating to liver parenchyma on delayed phase imaging, favored to represent a flash filling hemangioma. There are a few small cysts within the right hepatic lobe. Unremarkable gallbladder. No hyperdense gallstone. No biliary dilatation. Pancreas: Unremarkable. No pancreatic ductal dilatation or surrounding inflammatory changes. Spleen: Normal in size without focal abnormality. Adrenals/Urinary Tract: Unremarkable adrenal glands. Severe bilateral hydroureteronephrosis without renal or ureteral calculus. No abnormal renal cortical thinning to suggest a long-standing obstruction. Massive urinary bladder distension. Bladder measures approximately 21.6 x 14.7 x 19.0 cm (volume = 3160 cm^3). The base of the bladder is obscured by excessive streak artifact from patient's hip prostheses. No focal bladder  wall thickening is visualized. Stomach/Bowel: Stomach is within normal limits. Appendix appears normal (series 2, image 60). Colonic diverticulosis. No evidence of bowel wall thickening, distention, or inflammatory changes. Vascular/Lymphatic: Aortic atherosclerosis. No enlarged abdominal or pelvic lymph nodes. Reproductive: Enlarged prostate gland, which is largely obscured by streak artifact. Other: No free fluid. No abdominopelvic fluid collection. No pneumoperitoneum. No abdominal wall hernia. 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 No acute bony findings. No suspicious bone lesion. IMPRESSION: 1. Massive urinary bladder distension and severe bilateral hydroureteronephrosis. Findings are suggestive of bladder outlet obstruction. 2. Enlarged prostate gland. 3. Colonic diverticulosis without evidence of acute diverticulitis. 4. 1.6 cm area of focal hyperenhancement in the right hepatic lobe near the porta hepatis, favored to represent a flash filling hemangioma. Nonemergent abdominal MRI with contrast can be performed to confirm. 5. Coronary artery atherosclerosis (ICD10-I70.0). Electronically Signed   By: Davina Poke D.O.   On: 11/01/2022 14:20     Labs:   Basic Metabolic Panel: Recent Labs  Lab 11/01/22 1217 11/01/22 1750 11/02/22 0446  NA 136 139 140  K 4.4 3.0* 3.9  CL 104 117* 108  CO2 20* 18* 25  GLUCOSE 125* 85 96  BUN '14 10 13  '$ CREATININE 1.35* 0.85 1.32*  CALCIUM 9.6 6.8* 8.9   GFR Estimated Creatinine Clearance: 47.5 mL/min (A) (by C-G formula based on SCr of 1.32 mg/dL (H)). Liver Function Tests: Recent Labs  Lab 11/01/22 1217  AST 22  ALT 22  ALKPHOS 61  BILITOT 2.0*  PROT 7.0  ALBUMIN 4.7   Recent Labs  Lab 11/01/22 1217   LIPASE 28   No results for input(s): "AMMONIA" in the last 168 hours. Coagulation profile No results for input(s): "INR", "PROTIME" in the last 168 hours.  CBC: Recent Labs  Lab 11/01/22 1217 11/02/22 0446  WBC 6.4 6.1  NEUTROABS  --  3.3  HGB 14.9 14.5  HCT 42.3 42.2  MCV 91.2 93.4  PLT 220 219   Cardiac Enzymes: No results for input(s): "CKTOTAL", "CKMB", "CKMBINDEX", "TROPONINI" in the last 168 hours. BNP: Invalid input(s): "POCBNP" CBG: No results for input(s): "GLUCAP" in the last 168 hours. D-Dimer No results for input(s): "DDIMER" in the last 72 hours. Hgb A1c No results for input(s): "HGBA1C" in the last 72 hours. Lipid Profile No results for input(s): "CHOL", "HDL", "LDLCALC", "TRIG", "CHOLHDL", "LDLDIRECT" in the last 72 hours. Thyroid function studies No results for input(s): "TSH", "T4TOTAL", "T3FREE", "THYROIDAB" in the last 72 hours.  Invalid input(s): "FREET3" Anemia work up No results for input(s): "VITAMINB12", "FOLATE", "FERRITIN", "TIBC", "IRON", "RETICCTPCT" in the last 72 hours. Microbiology No results found for this or any previous visit (from the past 240 hour(s)).  Time coordinating discharge: 35 minutes  Signed: Afifa Truax  [facility] 11/02/2022, 12:55 PM

## 2022-11-02 NOTE — Progress Notes (Signed)
Reviewed written d/c instructions w pt and all questions answered. He verbalized understanding. Pt converted to leg bag and reviewed instructions w pt, he verb understanding. D/C via w/c w all belongings in stable condition.

## 2023-06-10 ENCOUNTER — Other Ambulatory Visit: Payer: Self-pay | Admitting: Urology

## 2023-06-10 DIAGNOSIS — R972 Elevated prostate specific antigen [PSA]: Secondary | ICD-10-CM

## 2023-09-10 ENCOUNTER — Other Ambulatory Visit (HOSPITAL_COMMUNITY): Payer: Self-pay | Admitting: Urology

## 2023-09-10 DIAGNOSIS — R972 Elevated prostate specific antigen [PSA]: Secondary | ICD-10-CM

## 2023-09-17 ENCOUNTER — Ambulatory Visit (HOSPITAL_COMMUNITY)
Admission: RE | Admit: 2023-09-17 | Discharge: 2023-09-17 | Disposition: A | Discharge status: Home / Self Care | Payer: Medicare PPO | Source: Ambulatory Visit | Attending: Urology

## 2023-09-17 ENCOUNTER — Ambulatory Visit (HOSPITAL_COMMUNITY)
Admission: RE | Admit: 2023-09-17 | Discharge: 2023-09-17 | Disposition: A | Discharge status: Home / Self Care | Payer: Medicare PPO | Source: Ambulatory Visit | Attending: Urology | Admitting: Urology

## 2023-09-17 ENCOUNTER — Other Ambulatory Visit (HOSPITAL_COMMUNITY): Payer: Self-pay | Admitting: Urology

## 2023-09-17 DIAGNOSIS — R972 Elevated prostate specific antigen [PSA]: Secondary | ICD-10-CM | POA: Diagnosis present

## 2023-09-17 MED ORDER — GADOBUTROL 1 MMOL/ML IV SOLN
8.0000 mL | Freq: Once | INTRAVENOUS | Status: AC | PRN
  Administered 2023-09-17: 8 mL via INTRAVENOUS

## 2023-12-24 ENCOUNTER — Other Ambulatory Visit (HOSPITAL_COMMUNITY): Payer: Self-pay | Admitting: Urology

## 2023-12-24 DIAGNOSIS — C61 Malignant neoplasm of prostate: Secondary | ICD-10-CM

## 2023-12-30 NOTE — Progress Notes (Signed)
GU Location of Tumor / Histology: Prostate Ca  If Prostate Cancer, Gleason Score is (4 + 5) and PSA is (5.40 on 06/04/2023)  Biopsies      12/31/2023 Dr. Jettie Pagan NM PET (PSMA) Skull to Mid Thigh CLINICAL DATA:   09/17/2023 Dr. Jettie Pagan MR Prostate with/without Contrast CLINICAL DATA: Elevated PSA level. R97.20   IMPRESSION: 1. PI-RADS category 4 lesion of the right anterior and posterior transition zone at the apex. Targeting data sent to [facility]. 2. Prostatomegaly and benign prostatic hypertrophy. 3. Sigmoid colon diverticulosis. 4. Mild bladder wall thickening possibly from chronic bladder outlet obstruction.  Past/Anticipated interventions by urology, if any:     Past/Anticipated interventions by medical oncology, if any: NA  Weight changes, if any:  No  IPSS: self caterterizes SHIM:  7  Bowel/Bladder complaints, if any:  Self catheterizes 4 times a day.  Nausea/Vomiting, if any: No  Pain issues, if any:  0/10  SAFETY ISSUES: Prior radiation?  No Pacemaker/ICD? No Possible current pregnancy? Male Is the patient on methotrexate? No  Current Complaints / other details:

## 2023-12-31 ENCOUNTER — Encounter (HOSPITAL_COMMUNITY)
Admission: RE | Admit: 2023-12-31 | Discharge: 2023-12-31 | Disposition: A | Discharge status: Home / Self Care | Payer: Medicare PPO | Source: Ambulatory Visit | Attending: Urology | Admitting: Urology

## 2023-12-31 DIAGNOSIS — C61 Malignant neoplasm of prostate: Secondary | ICD-10-CM | POA: Diagnosis present

## 2023-12-31 MED ORDER — FLOTUFOLASTAT F 18 GALLIUM 296-5846 MBQ/ML IV SOLN
8.6000 | Freq: Once | INTRAVENOUS | Status: AC
  Administered 2023-12-31: 8.6 via INTRAVENOUS
  Filled 2023-12-31: qty 9

## 2024-01-03 NOTE — Progress Notes (Signed)
STAT read request for PSMA PET for upcoming consult on 2/3.

## 2024-01-05 ENCOUNTER — Encounter: Payer: Self-pay | Admitting: Urology

## 2024-01-05 DIAGNOSIS — C61 Malignant neoplasm of prostate: Secondary | ICD-10-CM | POA: Insufficient documentation

## 2024-01-06 ENCOUNTER — Ambulatory Visit
Admission: RE | Admit: 2024-01-06 | Discharge: 2024-01-06 | Disposition: A | Discharge status: Home / Self Care | Payer: Medicare PPO | Source: Ambulatory Visit | Attending: Radiation Oncology | Admitting: Radiation Oncology

## 2024-01-06 ENCOUNTER — Encounter: Payer: Self-pay | Admitting: Radiation Oncology

## 2024-01-06 VITALS — BP 157/94 | HR 69 | Temp 97.3°F | Resp 18 | Ht 68.0 in | Wt 181.1 lb

## 2024-01-06 DIAGNOSIS — C61 Malignant neoplasm of prostate: Secondary | ICD-10-CM | POA: Diagnosis present

## 2024-01-06 DIAGNOSIS — Z79899 Other long term (current) drug therapy: Secondary | ICD-10-CM | POA: Diagnosis not present

## 2024-01-06 DIAGNOSIS — I700 Atherosclerosis of aorta: Secondary | ICD-10-CM | POA: Insufficient documentation

## 2024-01-06 DIAGNOSIS — M1990 Unspecified osteoarthritis, unspecified site: Secondary | ICD-10-CM | POA: Insufficient documentation

## 2024-01-06 DIAGNOSIS — R001 Bradycardia, unspecified: Secondary | ICD-10-CM | POA: Diagnosis not present

## 2024-01-06 DIAGNOSIS — K219 Gastro-esophageal reflux disease without esophagitis: Secondary | ICD-10-CM | POA: Insufficient documentation

## 2024-01-06 DIAGNOSIS — N189 Chronic kidney disease, unspecified: Secondary | ICD-10-CM | POA: Insufficient documentation

## 2024-01-06 DIAGNOSIS — N400 Enlarged prostate without lower urinary tract symptoms: Secondary | ICD-10-CM | POA: Diagnosis not present

## 2024-01-06 HISTORY — DX: Elevated prostate specific antigen (PSA): R97.20

## 2024-01-06 NOTE — Progress Notes (Addendum)
Radiation Oncology         [phone] ________________________________  Initial Outpatient Consultation  Name: Luke Becker MRN: [id number]  Date: 01/06/2024  DOB: 11-06-1946  WU:[id number], Resa Miner, FNP  Jannifer Hick, MD   REFERRING PHYSICIAN: Jannifer Hick, MD  DIAGNOSIS: 78 y.o. gentleman with Stage T1c adenocarcinoma of the prostate with Gleason score of 4+5, and PSA of 10.8 (adjusted for finasteride).    ICD-10-CM   1. Malignant neoplasm of prostate (HCC)  C61       HISTORY OF PRESENT ILLNESS: Luke Becker is a 78 y.o. male with a diagnosis of prostate cancer. 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 His BPH has been managed with finasteride and doxazosin for years but ultimately resulted in developing a severely distended, neurogenic bladder, now managed with CIC since 12/2022. More recently in 06/2023, he was noted to have an elevated PSA of 5.4 (10.8 when adjusted for finasteride) which prompted further evaluation with a prostate MRI that was performed on 09/17/23 and showed a PI-RADS 4 lesion in the right anterior and posterior transition zone at the apex. The patient proceeded to MRI fusion biopsy of the prostate on 12/11/23, digital rectal examination performed at that time showed no nodules.  The prostate volume measured 77 cc.  Out of 16 core biopsies, 7 were positive.  The maximum Gleason score was 4+5, and this was seen in all four cores from MRI ROI as well as the right apex. Additionally, a small focus of Gleason 4+3 was seen in the right apex lateral, and a small focus of Gleason 3+4 in the right mid.  A PSMA PET scan was performed on 12/31/23 for the staging and was without evidence of disease outside of the prostate.  The patient reviewed the biopsy and imaging results with his urologist and was started on Orgovyx ADT on 12/30/2023.  He has kindly been referred today for  discussion of potential radiation treatment options.   PREVIOUS RADIATION THERAPY: No  PAST MEDICAL HISTORY:  Past Medical History:  Diagnosis Date   BPH (benign prostatic hyperplasia)    Chronic kidney disease    urine retention due to BPH   Diverticulosis    Elevated PSA    GERD (gastroesophageal reflux disease)    History of ankle fracture    OA (osteoarthritis)    Sinus bradycardia       PAST SURGICAL HISTORY: Past Surgical History:  Procedure Laterality Date   CARPAL TUNNEL RELEASE Bilateral 2019   COLONOSCOPY     PROSTATE BIOPSY     PROSTATE BIOPSY     TONSILLECTOMY     as a child   TOTAL HIP ARTHROPLASTY Right 12/23/2017   Procedure: RIGHT TOTAL HIP ARTHROPLASTY ANTERIOR APPROACH;  Surgeon: Ollen Gross, MD;  Location: [facility];  Service: Orthopedics;  Laterality: Right;   TOTAL HIP ARTHROPLASTY Left 12/17/2018   Procedure: TOTAL HIP ARTHROPLASTY ANTERIOR APPROACH;  Surgeon: Ollen Gross, MD;  Location: [facility];  Service: Orthopedics;  Laterality: Left;    UMBILICAL HERNIA REPAIR N/A 10/09/2022   Procedure: UMBILICAL HERNIA REPAIR, POSSIBLY WITH MESH;  Surgeon: Quentin Ore, MD;  Location: [facility];  Service: General;  Laterality: N/A;    FAMILY HISTORY: History reviewed. No pertinent family history.  SOCIAL HISTORY:  Social History   Socioeconomic History   Marital status: Divorced    Spouse name: Not on  file   Number of children: Not on file   Years of education: Not on file   Highest education level: Not on file  Occupational History   Not on file  Tobacco Use   Smoking status: Never   Smokeless tobacco: Never  Vaping Use   Vaping status: Never Used  Substance and Sexual Activity   Alcohol use: Not Currently    Alcohol/week: 2.0 standard drinks of alcohol    Types: 2 Glasses of wine per week    Comment: stop 3 weeks ago   Drug use: No   Sexual activity: Yes  Other Topics Concern   Not on file  Social History Narrative   Not on  file   Social Drivers of Health   Financial Resource Strain: Low Risk  (12/17/2018)   Overall Financial Resource Strain (CARDIA)    Difficulty of Paying Living Expenses: Not hard at all  Food Insecurity: No Food Insecurity (01/06/2024)   Hunger Vital Sign    Worried About Running Out of Food in the Last Year: Never true    Ran Out of Food in the Last Year: Never true  Transportation Needs: No Transportation Needs (01/06/2024)   PRAPARE - Administrator, Civil Service (Medical): No    Lack of Transportation (Non-Medical): No  Physical Activity: Sufficiently Active (12/17/2018)   Exercise Vital Sign    Days of Exercise per Week: 6 days    Minutes of Exercise per Session: 60 min  Stress: No Stress Concern Present (12/17/2018)   [employer] of Occupational Health - Occupational Stress Questionnaire    Feeling of Stress : Only a little  Social Connections: Unknown (12/17/2018)   Social Connection and Isolation Panel [NHANES]    Frequency of Communication with Friends and Family: More than three times a week    Frequency of Social Gatherings with Friends and Family: More than three times a week    Attends Religious Services: Not on file    Active Member of Clubs or Organizations: Not on file    Attends Banker Meetings: Not on file    Marital Status: Not on file  Intimate Partner Violence: Not At Risk (01/06/2024)   Humiliation, Afraid, Rape, and Kick questionnaire    Fear of Current or Ex-Partner: No    Emotionally Abused: No    Physically Abused: No    Sexually Abused: No    ALLERGIES: Patient has no active allergies.  MEDICATIONS:  Current Outpatient Medications  Medication Sig Dispense Refill   atorvastatin (LIPITOR) 10 MG tablet Take 10 mg by mouth every evening.     doxazosin (CARDURA) 4 MG tablet Take 4 mg by mouth daily.     finasteride (PROSCAR) 5 MG tablet Take 5 mg by mouth in the morning.     acetaminophen (TYLENOL) 500 MG tablet Take 1,000 mg  by mouth every 6 (six) hours as needed for moderate pain or headache.     Creatine POWD Take 1 Scoop by mouth daily as needed (post workout (~3 times a week)). (Patient not taking: Reported on 01/06/2024)     docusate sodium (COLACE) 100 MG capsule Take 100 mg by mouth daily as needed for mild constipation.     Glutamine POWD Take 1 Scoop by mouth daily as needed (post workout (~3 times a week)). (Patient not taking: Reported on 01/06/2024)     Magnesium 500 MG TABS Take by mouth in the morning.     Methylsulfonylmethane (MSM) 1000 MG TABS  Take 1,000 mg by mouth in the morning. (Patient not taking: Reported on 01/06/2024)     ORGOVYX 120 MG tablet Take 120 mg by mouth daily.     Protein POWD Take 1 Scoop by mouth daily as needed (post workout (~3 times a week)). (Patient not taking: Reported on 01/06/2024)     silodosin (RAPAFLO) 8 MG CAPS capsule Take 8 mg by mouth daily. Pt takes at evening     No current facility-administered medications for this encounter.      REVIEW OF SYSTEMS:  On review of systems, the patient reports that he is doing well overall. He denies any chest pain, shortness of breath, cough, fevers, chills, night sweats, unintended weight changes. He denies any bowel disturbances, and denies abdominal pain, nausea or vomiting. He denies any new musculoskeletal or joint aches or pains. His IPSS was not relevant since he has an areflexic neurogenic bladder and requires intermittent self catheterization. He no longer has a normal urge to void.  His SHIM: 7, indicates that he has severe erectile dysfunction (Reference - 22-25 None, 17-21 Mild, 8-16 Moderate, 1-7 Severe). A complete review of systems is obtained and is otherwise negative.   PHYSICAL EXAM:  Wt Readings from Last 3 Encounters:  01/06/24 181 lb 2 oz (82.2 kg)  11/02/22 175 lb 0.7 oz (79.4 kg)  10/09/22 175 lb 0.7 oz (79.4 kg)   Temp Readings from Last 3 Encounters:  01/06/24 (!) 97.3 F (36.3 C) (Temporal)  11/02/22 98.2  F (36.8 C) (Oral)  10/09/22 97.8 F (36.6 C)   BP Readings from Last 3 Encounters:  01/06/24 (!) 157/94  11/02/22 126/83  10/09/22 (!) 142/77   Pulse Readings from Last 3 Encounters:  01/06/24 69  11/02/22 67  10/09/22 (!) 56   Pain Assessment Pain Score: 0-No pain/10  In general this is a well appearing Caucasian male in no acute distress. He's alert and oriented x4 and appropriate throughout the examination. Cardiopulmonary assessment is negative for acute distress, and he exhibits normal effort.     KPS = 100  100 - Normal; no complaints; no evidence of disease. 90   - Able to carry on normal activity; minor signs or symptoms of disease. 80   - Normal activity with effort; some signs or symptoms of disease. 65   - Cares for self; unable to carry on normal activity or to do active work. 60   - Requires occasional assistance, but is able to care for most of his personal needs. 50   - Requires considerable assistance and frequent medical care. 40   - Disabled; requires special care and assistance. 30   - Severely disabled; hospital admission is indicated although death not imminent. 20   - Very sick; hospital admission necessary; active supportive treatment necessary. 10   - Moribund; fatal processes progressing rapidly. 0     - Dead  Karnofsky DA, Abelmann WH, Craver LS and Burchenal [facility] [phone]) The use of the nitrogen mustards in the palliative treatment of carcinoma: with particular reference to bronchogenic carcinoma Cancer 1 634-56  LABORATORY DATA:  Lab Results  Component Value Date   WBC 6.1 11/02/2022   HGB 14.5 11/02/2022   HCT 42.2 11/02/2022   MCV 93.4 11/02/2022   PLT 219 11/02/2022   Lab Results  Component Value Date   NA 140 11/02/2022   K 3.9 11/02/2022   CL 108 11/02/2022   CO2 25 11/02/2022   Lab Results  Component Value Date  ALT 22 11/01/2022   AST 22 11/01/2022   ALKPHOS 61 11/01/2022   BILITOT 2.0 (H) 11/01/2022     RADIOGRAPHY: NM PET  (PSMA) SKULL TO MID THIGH Result Date: 01/03/2024 CLINICAL DATA:  Prostate carcinoma.  PSA equal 5.4. EXAM: NUCLEAR MEDICINE PET SKULL BASE TO THIGH TECHNIQUE: 8.6 mCi F18 Piflufolastat (Pylarify) was injected intravenously. Full-ring PET imaging was performed from the skull base to thigh after the radiotracer. CT data was obtained and used for attenuation correction and anatomic localization. COMPARISON:  None Available. FINDINGS: NECK No radiotracer activity in neck lymph nodes. Incidental CT finding: None. CHEST No radiotracer accumulation within mediastinal or hilar lymph nodes. No suspicious pulmonary nodules on the CT scan. Incidental CT finding: Coronary artery calcification and aortic atherosclerotic calcification. ABDOMEN/PELVIS Prostate: Intense radiotracer activity localizes to the RIGHT lobe of the prostate gland SUV max equal 16.4 on image 192. Lymph nodes: No abnormal radiotracer accumulation within pelvic or abdominal nodes. There is significant streak artifact through the pelvis related to the bilateral hip prosthetics. Liver: No evidence of liver metastasis. Incidental CT finding: Bilateral extrarenal pelves. Thick-walled bladder. Atherosclerotic calcification of the aorta. SKELETON No focal activity to suggest skeletal metastasis. IMPRESSION: 1. Intense radiotracer activity in the RIGHT lobe of the prostate gland consistent primary prostate adenocarcinoma. 2. No evidence of metastatic adenopathy in the pelvis or periaortic retroperitoneum. 3. No evidence of visceral metastasis or skeletal metastasis. 4. Bilateral extrarenal pelves.  Thick-walled bladder. 5. Extensive streak artifact of the pelvis related to the bilateral hip prosthetics. Electronically Signed   By: Genevive Bi M.D.   On: 01/03/2024 16:36      IMPRESSION/PLAN: 1. 78 y.o. gentleman with Stage T1c adenocarcinoma of the prostate with Gleason score of 4+5, and PSA of 10.8 (adjusted for finasteride).  We discussed the  patient's workup and outlined the nature of prostate cancer in this setting. The patient's T stage, Gleason's score, and PSA put him into the high risk group. Accordingly, he is eligible for a variety of potential treatment options including prostatectomy or LT-ADT concurrent with either 8 weeks of external radiation or 5 weeks of external radiation with an upfront brachytherapy boost. We discussed the available radiation techniques, and focused on the details and logistics of delivery. The patient is not an ideal candidate for brachytherapy boost with a prostate volume of 77 cc, as well as his history of BOO.  Therefore, we discussed and outlined the risks, benefits, short and long-term effects associated with radiotherapy and compared and contrasted these with prostatectomy. We discussed the role of SpaceOAR gel in reducing the rectal toxicity associated with radiotherapy. We also detailed the role of ADT in the treatment of high risk prostate cancer and outlined the associated side effects that could be expected with this therapy. He appears to have a good understanding of his disease and our treatment recommendations which are of curative intent.  He was encouraged to ask questions that were answered to his stated satisfaction.  At the conclusion of our conversation, the patient is interested in moving forward with LT-ADT concurrent with 8 weeks of daily IMRT.  This will medically necessitate placement of gold fiducial markers for IGRT and SpaceOAR to maximize rectal sparing.  He has already started Orgovyx ADT on 12/30/2023 so we'll share our discussion with Dr. Cardell Peach and coordinate for fiducial markers and SpaceOAR gel placement in late March or early April 2025, in anticipation of beginning his daily radiation treatments approximately 2 months from the start of ADT.  We enjoyed meeting him today and look forward to continuing to participate in his care.  We personally spent 60 minutes in this encounter  including chart review, reviewing radiological studies, meeting face-to-face with the patient, entering orders and completing documentation.    Marguarite Arbour, PA-C    Margaretmary Dys, MD  [facility]  Radiation Oncology Direct Dial: [phone]  Fax: [phone] [email or web address]  Skype  [email or web address]   This document serves as a record of services personally performed by Margaretmary Dys, MD and Marcello Fennel, PA-C. It was created on their behalf by Mickie Bail, a trained medical scribe. The creation of this record is based on the scribe's personal observations and the provider's statements to them. This document has been checked and approved by the attending provider.

## 2024-01-06 NOTE — Progress Notes (Signed)
Introduced myself to the patient as the prostate nurse navigator.  No barriers to care identified at this time.  He is here to discuss his radiation treatment options and will proceed with ADT and daily radiation.  I gave him my business card and asked him to call me with questions or concerns.  Verbalized understanding.

## 2024-01-06 NOTE — Addendum Note (Signed)
Encounter addended by: Marcello Fennel, PA-C on: 01/06/2024 8:07 PM  Actions taken: Flowsheet accepted, In Basket message sent, Clinical Note Signed

## 2024-01-27 ENCOUNTER — Telehealth: Payer: Self-pay | Admitting: *Deleted

## 2024-01-27 ENCOUNTER — Other Ambulatory Visit: Payer: Self-pay | Admitting: Urology

## 2024-01-27 NOTE — Telephone Encounter (Signed)
 RETURNED PATIENT'S PHONE CALL, SPOKE WITH PATIENT. ?

## 2024-01-30 ENCOUNTER — Other Ambulatory Visit: Payer: Self-pay | Admitting: Urology

## 2024-02-12 ENCOUNTER — Encounter (HOSPITAL_COMMUNITY): Payer: Self-pay | Admitting: Urology

## 2024-02-12 NOTE — Progress Notes (Signed)
 Spoke w/ via phone for pre-op interview--- Luke Becker Lab needs dos----  EKG and BMP per anesthesia       Lab results------ COVID test -----patient states asymptomatic no test needed Arrive at -------1000 NPO after MN NO Solid Food.  Clear liquids from MN until---0900 Pre-Surgery Ensure or G2:  Med rec completed Medications to take morning of surgery -----Doxazosin, Lipitor and Orgovyx Diabetic medication -----  GLP1 agonist last dose: GLP1 instructions:  Patient instructed no nail polish to be worn day of surgery Patient instructed to bring photo id and insurance card day of surgery Patient aware to have Driver (ride ) / caregiver    for 24 hours after surgery - Luke Becker Patient Special Instructions -----Shower with antibacterial soap, Fleet enema per Careers adviser instructions. Pre-Op special Instructions -----  Patient verbalized understanding of instructions that were given at this phone interview. Patient denies chest pain, sob, fever, cough at the interview.

## 2024-02-21 ENCOUNTER — Telehealth: Payer: Self-pay | Admitting: *Deleted

## 2024-02-21 ENCOUNTER — Ambulatory Visit (HOSPITAL_BASED_OUTPATIENT_CLINIC_OR_DEPARTMENT_OTHER): Admitting: Anesthesiology

## 2024-02-21 ENCOUNTER — Other Ambulatory Visit: Payer: Self-pay

## 2024-02-21 ENCOUNTER — Encounter (HOSPITAL_COMMUNITY): Payer: Self-pay | Admitting: Urology

## 2024-02-21 ENCOUNTER — Encounter (HOSPITAL_COMMUNITY)
Admission: RE | Disposition: A | Discharge status: Home / Self Care | Payer: Self-pay | Source: Ambulatory Visit | Attending: Urology

## 2024-02-21 ENCOUNTER — Ambulatory Visit (HOSPITAL_COMMUNITY)
Admission: RE | Admit: 2024-02-21 | Discharge: 2024-02-21 | Disposition: A | Discharge status: Home / Self Care | Payer: Medicare PPO | Source: Ambulatory Visit | Attending: Urology | Admitting: Urology

## 2024-02-21 ENCOUNTER — Ambulatory Visit (HOSPITAL_COMMUNITY): Admitting: Anesthesiology

## 2024-02-21 DIAGNOSIS — N401 Enlarged prostate with lower urinary tract symptoms: Secondary | ICD-10-CM | POA: Insufficient documentation

## 2024-02-21 DIAGNOSIS — C61 Malignant neoplasm of prostate: Secondary | ICD-10-CM | POA: Diagnosis present

## 2024-02-21 DIAGNOSIS — R338 Other retention of urine: Secondary | ICD-10-CM | POA: Diagnosis not present

## 2024-02-21 DIAGNOSIS — I10 Essential (primary) hypertension: Secondary | ICD-10-CM | POA: Insufficient documentation

## 2024-02-21 HISTORY — PX: GOLD SEED IMPLANT: SHX6343

## 2024-02-21 HISTORY — DX: Malignant (primary) neoplasm, unspecified: C80.1

## 2024-02-21 HISTORY — PX: SPACE OAR INSTILLATION: SHX6769

## 2024-02-21 SURGERY — INSERTION, GOLD SEEDS
Anesthesia: Monitor Anesthesia Care | Site: Prostate

## 2024-02-21 MED ORDER — FENTANYL CITRATE (PF) 100 MCG/2ML IJ SOLN: 25.0000 ug | INTRAMUSCULAR | Status: DC | PRN

## 2024-02-21 MED ORDER — OXYCODONE HCL 5 MG PO TABS: 5.0000 mg | ORAL_TABLET | Freq: Once | ORAL | Status: DC | PRN

## 2024-02-21 MED ORDER — SODIUM CHLORIDE (PF) 0.9 % IJ SOLN
INTRAMUSCULAR | Status: AC
  Filled 2024-02-21: qty 10

## 2024-02-21 MED ORDER — ONDANSETRON HCL 4 MG/2ML IJ SOLN
INTRAMUSCULAR | Status: DC | PRN
  Administered 2024-02-21: 4 mg via INTRAVENOUS

## 2024-02-21 MED ORDER — PHENYLEPHRINE 80 MCG/ML (10ML) SYRINGE FOR IV PUSH (FOR BLOOD PRESSURE SUPPORT)
PREFILLED_SYRINGE | INTRAVENOUS | Status: DC | PRN
  Administered 2024-02-21: 80 ug via INTRAVENOUS

## 2024-02-21 MED ORDER — DEXAMETHASONE SODIUM PHOSPHATE 10 MG/ML IJ SOLN
INTRAMUSCULAR | Status: AC
  Filled 2024-02-21: qty 1

## 2024-02-21 MED ORDER — PROPOFOL 10 MG/ML IV BOLUS
INTRAVENOUS | Status: DC | PRN
  Administered 2024-02-21: 75 ug/kg/min via INTRAVENOUS
  Administered 2024-02-21: 50 mg via INTRAVENOUS

## 2024-02-21 MED ORDER — SODIUM CHLORIDE (PF) 0.9 % IJ SOLN
INTRAMUSCULAR | Status: DC | PRN
  Administered 2024-02-21: 10 mL

## 2024-02-21 MED ORDER — PHENYLEPHRINE 80 MCG/ML (10ML) SYRINGE FOR IV PUSH (FOR BLOOD PRESSURE SUPPORT)
PREFILLED_SYRINGE | INTRAVENOUS | Status: AC
  Filled 2024-02-21: qty 10

## 2024-02-21 MED ORDER — ONDANSETRON HCL 4 MG/2ML IJ SOLN
INTRAMUSCULAR | Status: AC
  Filled 2024-02-21: qty 2

## 2024-02-21 MED ORDER — ACETAMINOPHEN 500 MG PO TABS
ORAL_TABLET | ORAL | Status: AC
  Administered 2024-02-21: 1000 mg via ORAL
  Filled 2024-02-21: qty 2

## 2024-02-21 MED ORDER — CHLORHEXIDINE GLUCONATE 0.12 % MT SOLN: 15.0000 mL | Freq: Once | OROMUCOSAL | Status: AC

## 2024-02-21 MED ORDER — ACETAMINOPHEN 500 MG PO TABS: 1000.0000 mg | ORAL_TABLET | Freq: Once | ORAL | Status: AC

## 2024-02-21 MED ORDER — LACTATED RINGERS IV SOLN: INTRAVENOUS | Status: DC

## 2024-02-21 MED ORDER — TRAMADOL HCL 50 MG PO TABS: 50.0000 mg | ORAL_TABLET | Freq: Four times a day (QID) | ORAL | 0 refills | Status: AC | PRN

## 2024-02-21 MED ORDER — DROPERIDOL 2.5 MG/ML IJ SOLN: 0.6250 mg | Freq: Once | INTRAMUSCULAR | Status: DC | PRN

## 2024-02-21 MED ORDER — CEFAZOLIN SODIUM-DEXTROSE 2-4 GM/100ML-% IV SOLN
2.0000 g | INTRAVENOUS | Status: AC
Start: 2024-02-21 — End: 2024-02-21
  Administered 2024-02-21: 2 g via INTRAVENOUS

## 2024-02-21 MED ORDER — FENTANYL CITRATE (PF) 250 MCG/5ML IJ SOLN
INTRAMUSCULAR | Status: DC | PRN
  Administered 2024-02-21: 50 ug via INTRAVENOUS

## 2024-02-21 MED ORDER — DEXAMETHASONE SODIUM PHOSPHATE 10 MG/ML IJ SOLN
INTRAMUSCULAR | Status: DC | PRN
  Administered 2024-02-21: 8 mg via INTRAVENOUS

## 2024-02-21 MED ORDER — CHLORHEXIDINE GLUCONATE 0.12 % MT SOLN
OROMUCOSAL | Status: AC
  Administered 2024-02-21: 15 mL via OROMUCOSAL
  Filled 2024-02-21: qty 15

## 2024-02-21 MED ORDER — LIDOCAINE HCL (PF) 2 % IJ SOLN
INTRAMUSCULAR | Status: AC
  Filled 2024-02-21: qty 10

## 2024-02-21 MED ORDER — FENTANYL CITRATE (PF) 250 MCG/5ML IJ SOLN
INTRAMUSCULAR | Status: AC
  Filled 2024-02-21: qty 5

## 2024-02-21 MED ORDER — OXYCODONE HCL 5 MG/5ML PO SOLN: 5.0000 mg | Freq: Once | ORAL | Status: DC | PRN

## 2024-02-21 MED ORDER — LACTATED RINGERS IV SOLN: INTRAVENOUS | Status: DC | PRN

## 2024-02-21 MED ORDER — LIDOCAINE HCL 2 % IJ SOLN
INTRAMUSCULAR | Status: DC | PRN
  Administered 2024-02-21: 10 mL

## 2024-02-21 MED ORDER — ORAL CARE MOUTH RINSE: 15.0000 mL | Freq: Once | OROMUCOSAL | Status: AC

## 2024-02-21 MED ORDER — CEFAZOLIN SODIUM-DEXTROSE 2-4 GM/100ML-% IV SOLN
INTRAVENOUS | Status: AC
  Filled 2024-02-21: qty 100

## 2024-02-21 SURGICAL SUPPLY — 23 items
BLADE CLIPPER SENSICLIP SURGIC (BLADE) ×1 IMPLANT
CNTNR URN SCR LID CUP LEK RST (MISCELLANEOUS) ×1 IMPLANT
COVER BACK TABLE 60X90IN (DRAPES) ×1 IMPLANT
DRSG TEGADERM 4X4.75 (GAUZE/BANDAGES/DRESSINGS) IMPLANT
DRSG TEGADERM 8X12 (GAUZE/BANDAGES/DRESSINGS) ×1 IMPLANT
GAUZE SPONGE 4X4 12PLY STRL (GAUZE/BANDAGES/DRESSINGS) IMPLANT
GLOVE BIO SURGEON STRL SZ7.5 (GLOVE) ×1 IMPLANT
GLOVE SURG ORTHO 8.5 STRL (GLOVE) ×1 IMPLANT
IMPL SPACEOAR VUE SYSTEM (Spacer) ×1 IMPLANT
IMPLANT SPACEOAR VUE SYSTEM (Spacer) ×1 IMPLANT
MARKER GOLD PRELOAD 1.2X3 (Urological Implant) ×1 IMPLANT
MARKER SKIN DUAL TIP RULER LAB (MISCELLANEOUS) ×1 IMPLANT
NDL SPNL 22GX3.5 QUINCKE BK (NEEDLE) ×1 IMPLANT
NEEDLE SPNL 22GX3.5 QUINCKE BK (NEEDLE) ×1 IMPLANT
SEED GOLD PRELOAD 1.2X3 (Urological Implant) ×1 IMPLANT
SHEATH ULTRASOUND LF (SHEATH) IMPLANT
SHEATH ULTRASOUND LTX NONSTRL (SHEATH) IMPLANT
SLEEVE SCD COMPRESS KNEE MED (STOCKING) ×1 IMPLANT
SURGILUBE 2OZ TUBE FLIPTOP (MISCELLANEOUS) ×1 IMPLANT
SYR 10ML LL (SYRINGE) IMPLANT
SYR CONTROL 10ML LL (SYRINGE) ×1 IMPLANT
TOWEL OR 17X24 6PK STRL BLUE (TOWEL DISPOSABLE) ×1 IMPLANT
UNDERPAD 30X36 HEAVY ABSORB (UNDERPADS AND DIAPERS) ×1 IMPLANT

## 2024-02-21 NOTE — Discharge Instructions (Addendum)
 1 - You may have urinary urgency (bladder spasms) and bloody urine on / off for few days. This is normal.  2 - Call MD or go to ER for fever >102, severe pain / nausea / vomiting not relieved by medications, or acute change in medical status     No acetaminophen/Tylenol until after 4:30 pm today if needed.     Post Anesthesia Home Care Instructions  Activity: Get plenty of rest for the remainder of the day. A responsible individual must stay with you for 24 hours following the procedure.  For the next 24 hours, DO NOT: -Drive a car -Advertising copywriter -Drink alcoholic beverages -Take any medication unless instructed by your physician -Make any legal decisions or sign important papers.  Meals: Start with liquid foods such as gelatin or soup. Progress to regular foods as tolerated. Avoid greasy, spicy, heavy foods. If nausea and/or vomiting occur, drink only clear liquids until the nausea and/or vomiting subsides. Call your physician if vomiting continues.  Special Instructions/Symptoms: Your throat may feel dry or sore from the anesthesia or the breathing tube placed in your throat during surgery. If this causes discomfort, gargle with warm salt water. The discomfort should disappear within 24 hours.

## 2024-02-21 NOTE — Telephone Encounter (Signed)
 CALLED PATIENT TO REMIND OF SIM APPT. FOR 02-24-24- ARRIVAL TIME- 8:45 AM @ [facility], INFORMED PATIENT TO ARRIVE WITH A FULL BLADDER,UNABLE TO LEAVE MESSAGE DUE TO VM BEING FULL

## 2024-02-21 NOTE — H&P (Signed)
 Luke Becker is an 78 y.o. male.    Chief Complaint: Pre-OP Prostate Fiducial Marker and SPACE-OAR Placement  HPI:   1 - High Risk Prostate Cancer - grade 5 cancer on BX 2025. PET localized. TRUS 65gm.  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  Today "Luke Becker" is seen to proceed with prostate fiducial marker and SPACE-OR placement.   Past Medical History:  Diagnosis Date   BPH (benign prostatic hyperplasia)    Cancer (HCC)    Chronic kidney disease    urine retention due to BPH   Diverticulosis    Elevated PSA    GERD (gastroesophageal reflux disease)    History of ankle fracture    OA (osteoarthritis)    Sinus bradycardia     Past Surgical History:  Procedure Laterality Date   CARPAL TUNNEL RELEASE Bilateral 2019   COLONOSCOPY     PROSTATE BIOPSY     PROSTATE BIOPSY     TONSILLECTOMY     as a child   TOTAL HIP ARTHROPLASTY Right 12/23/2017   Procedure: RIGHT TOTAL HIP ARTHROPLASTY ANTERIOR APPROACH;  Surgeon: Ollen Gross, MD;  Location: [facility];  Service: Orthopedics;  Laterality: Right;   TOTAL HIP ARTHROPLASTY Left 12/17/2018   Procedure: TOTAL HIP ARTHROPLASTY ANTERIOR APPROACH;  Surgeon: Ollen Gross, MD;  Location: [facility];  Service: Orthopedics;  Laterality: Left;    UMBILICAL HERNIA REPAIR N/A 10/09/2022   Procedure: UMBILICAL HERNIA REPAIR, POSSIBLY WITH MESH;  Surgeon: Quentin Ore, MD;  Location: [facility];  Service: General;  Laterality: N/A;    History reviewed. No pertinent family history. Social History:  reports that he has never smoked. He has never used smokeless tobacco. He reports that he does not currently use alcohol after a past usage of about 2.0 standard drinks of alcohol per week. He reports that he does not use drugs.  Allergies: No Known Allergies  No medications prior to admission.    No results found for this or any previous visit (from the past 48 hours). No results found.  Review  of Systems  Constitutional:  Negative for chills and fever.  All other systems reviewed and are negative.   Height 5\' 8"  (1.727 m), weight 81.2 kg. Physical Exam Vitals reviewed.  HENT:     Head: Normocephalic.  Eyes:     Pupils: Pupils are equal, round, and reactive to light.  Cardiovascular:     Rate and Rhythm: Normal rate.  Pulmonary:     Effort: Pulmonary effort is normal.  Abdominal:     General: Abdomen is flat.  Genitourinary:    Comments: No CVAT at present Musculoskeletal:        General: Normal range of motion.     Cervical back: Normal range of motion.  Neurological:     General: No focal deficit present.     Mental Status: He is alert.  Psychiatric:        Mood and Affect: Mood normal.      Assessment/Plan  Proceed as planned with prostate fiducial marker placement and SPACE-OAR. Risks, benefits, alternatives, expected peri-op course discussed previously and reiterated today.   Loletta Parish., MD 02/21/2024, 7:24 AM

## 2024-02-21 NOTE — Transfer of Care (Signed)
 Immediate Anesthesia Transfer of Care Note  Patient: Luke Becker  Procedure(s) Performed: INSERTION, GOLD SEEDS (Prostate) INJECTION, HYDROGEL SPACER (Prostate)  Patient Location: PACU  Anesthesia Type:MAC  Level of Consciousness: awake, alert , and oriented  Airway & Oxygen Therapy: Patient Spontanous Breathing and Patient connected to nasal cannula oxygen  Post-op Assessment: Report given to RN and Post -op Vital signs reviewed and stable  Post vital signs: Reviewed and stable  Last Vitals:  Vitals Value Taken Time  BP 101/63 02/21/24 1234  Temp    Pulse 63 02/21/24 1236  Resp 15 02/21/24 1236  SpO2 96 % 02/21/24 1236  Vitals shown include unfiled device data.  Last Pain:  Vitals:   02/21/24 1053  TempSrc: Oral  PainSc: 0-No pain         Complications: No notable events documented.

## 2024-02-21 NOTE — Anesthesia Postprocedure Evaluation (Signed)
 Anesthesia Post Note  Patient: Arlana Lindau Featherly  Procedure(s) Performed: INSERTION, GOLD SEEDS (Prostate) INJECTION, HYDROGEL SPACER (Prostate)     Patient location during evaluation: PACU Anesthesia Type: MAC Level of consciousness: awake and alert Pain management: pain level controlled Vital Signs Assessment: post-procedure vital signs reviewed and stable Respiratory status: spontaneous breathing, nonlabored ventilation, respiratory function stable and patient connected to nasal cannula oxygen Cardiovascular status: stable and blood pressure returned to baseline Postop Assessment: no apparent nausea or vomiting Anesthetic complications: no  No notable events documented.  Last Vitals:  Vitals:   02/21/24 1300 02/21/24 1308  BP: 100/72 120/79  Pulse: 65 64  Resp: 17   Temp:  36.4 C  SpO2: 95% 94%    Last Pain:  Vitals:   02/21/24 1308  TempSrc:   PainSc: 0-No pain                 Mayco Walrond L Tayvien Kane

## 2024-02-21 NOTE — Anesthesia Preprocedure Evaluation (Addendum)
 Anesthesia Evaluation  Patient identified by MRN, date of birth, ID band Patient awake    Reviewed: Allergy & Precautions, NPO status , Patient's Chart, lab work & pertinent test results  History of Anesthesia Complications Negative for: history of anesthetic complications  Airway Mallampati: II  TM Distance: >3 FB Neck ROM: Full    Dental  (+) Teeth Intact, Dental Advisory Given   Pulmonary neg pulmonary ROS   Pulmonary exam normal breath sounds clear to auscultation       Cardiovascular hypertension, Normal cardiovascular exam Rhythm:Regular Rate:Normal     Neuro/Psych    GI/Hepatic Neg liver ROS,GERD  ,,  Endo/Other  negative endocrine ROS    Renal/GU   negative genitourinary   Musculoskeletal  (+) Arthritis ,    Abdominal   Peds  Hematology negative hematology ROS (+)   Anesthesia Other Findings Prostate ca  Reproductive/Obstetrics                             Anesthesia Physical Anesthesia Plan  ASA: 2  Anesthesia Plan: MAC   Post-op Pain Management: Tylenol PO (pre-op)*   Induction:   PONV Risk Score and Plan: 1 and Treatment may vary due to age or medical condition, Propofol infusion and Ondansetron  Airway Management Planned: Natural Airway and Simple Face Mask  Additional Equipment: None  Intra-op Plan:   Post-operative Plan:   Informed Consent: I have reviewed the patients History and Physical, chart, labs and discussed the procedure including the risks, benefits and alternatives for the proposed anesthesia with the patient or authorized representative who has indicated his/her understanding and acceptance.     Dental advisory given  Plan Discussed with: CRNA  Anesthesia Plan Comments:        Anesthesia Quick Evaluation

## 2024-02-21 NOTE — Brief Op Note (Signed)
 02/21/2024  12:26 PM  PATIENT:  Luke Becker  78 y.o. male  PRE-OPERATIVE DIAGNOSIS:  PROSTATE CANCER  POST-OPERATIVE DIAGNOSIS:  PROSTATE CANCER  PROCEDURE:  Procedure(s) with comments: INSERTION, GOLD SEEDS (N/A) - /WLSC INJECTION, HYDROGEL SPACER (N/A)  SURGEON:  Surgeons and Role:    * Manny, Delbert Phenix., MD - Primary  PHYSICIAN ASSISTANT:   ASSISTANTS: none   ANESTHESIA:   local and general  EBL:  minimal   BLOOD ADMINISTERED:none  DRAINS: none   LOCAL MEDICATIONS USED:  LIDOCAINE   SPECIMEN:  No Specimen  DISPOSITION OF SPECIMEN:  N/A  COUNTS:  YES  TOURNIQUET:  * No tourniquets in log *  DICTATION: .Other Dictation: Dictation Number [id number]  PLAN OF CARE: Discharge to home after PACU  PATIENT DISPOSITION:  PACU - hemodynamically stable.   Delay start of Pharmacological VTE agent (>24hrs) due to surgical blood loss or risk of bleeding: not applicable

## 2024-02-21 NOTE — Telephone Encounter (Signed)
 CALLED PATIENT TO REMIND OF SIM APPT. FOR 02-24-24- ARRIVAL TIME- 8:45 AM @ [facility], INFORMED PATIENT TO ARRIVE WITH A FULL BLADDER, SPOKE WITH PATIENT AND HE IS AWARE OF THIS APPT. AND THE INSTRUCTIONS

## 2024-02-21 NOTE — Anesthesia Procedure Notes (Signed)
 Procedure Name: MAC Date/Time: 02/21/2024 12:18 PM  Performed by: Micki Riley, CRNAPre-anesthesia Checklist: Patient identified Patient Re-evaluated:Patient Re-evaluated prior to induction Oxygen Delivery Method: Nasal cannula Preoxygenation: Pre-oxygenation with 100% oxygen Induction Type: IV induction

## 2024-02-21 NOTE — Op Note (Signed)
 NAME: Luke Becker, Luke Becker MEDICAL RECORD NO: [id number] ACCOUNT NO: [id number] DATE OF BIRTH: 1946-03-22 FACILITY: [facility] LOCATION: [facility] PHYSICIAN: Sebastian Ache, MD  Operative Report   DATE OF PROCEDURE: 02/21/2024  SURGEON:  Sebastian Ache, MD.  PREOPERATIVE DIAGNOSIS:  High-risk prostate cancer.  PROCEDURE PERFORMED: 1.  Prostate fiducial marker placement. 2.  SpaceOAR placement. 3.  Transrectal ultrasound for fiducial marker placement.  ESTIMATED BLOOD LOSS:  Nil.  COMPLICATIONS:  None.  SPECIMENS:  None.  FINDINGS: 1.  Excellent placement of fiducial markers in the right mid base, right mid apex, left mid lateral locations. 2.  Excellent displacement of the anterior rectal wall away from the rectum with SpaceOAR matrix placement.  INDICATIONS:  The patient is a pleasant and quite vigorous 78 year old man with a longstanding history of urinary retention on self-cath.  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  He is on a curative  intent path following radiation.  As a part of this, he presents for facial marker placement with SpaceOAR.  He is quite content with self-cath and understands he will depend on this after radiation.  Informed consent was obtained and placed in medical  record.   DESCRIPTION OF PROCEDURE:  The patient being [employer] verified, procedure being prostate fiducial marker placement and SpaceOAR was confirmed.  Procedure timeout was performed.  Intravenous antibiotics were administered.  Monitored anesthesia care.   Conscious sedation was delivered.  The patient was placed in a medium lithotomy position.  Sterile field was created by prepping and draping patient's perineum using iodine by clipping and shaving.  The scrotum and penis were tucked out of the  operative field with large Tegaderm.  Transrectal ultrasound probe was introduced.  Under ultrasound guidance, fiducial markers were placed in  transperineal location after infiltration of the tract with lidocaine.  Fiducial markers were placed into a  right base medial, right apex medial, left mid lateral locations respectively.  The ***  probe was then taken off of anterior tension allowing the perirectal space to open and using the introducer needle midline transperineally, the perirectal space was  entered at the corresponding mid gland midline of the prostate. Position was confirmed using 2 mL of normal saline hydrodissection.  Next, 10 mL of SpaceOAR gel matrix was then placed across 10 seconds, which showed an excellent posterior displacement of  anterior rectal wall.  Procedure was then terminated.  The patient tolerated the procedure well.  No immediate periprocedural complications.  The patient was taken to postanesthesia care unit in stable condition.  Plan to discharge home.   NIK D: 02/21/2024 12:30:37 pm T: 02/21/2024 9:16:00 pm  JOB: [id number]/ [id number]

## 2024-02-23 NOTE — Progress Notes (Signed)
  Radiation Oncology         (336) [phone] ________________________________  Name: Luke Becker MRN: [id number]  Date: 02/24/2024  DOB: 28-Jun-1946  SIMULATION AND TREATMENT PLANNING NOTE    ICD-10-CM   1. Malignant neoplasm of prostate (HCC)  C61       DIAGNOSIS:  78 y.o. gentleman with Stage T1c adenocarcinoma of the prostate with Gleason score of 4+5, and PSA of 10.8 (adjusted for finasteride).   NARRATIVE:  The patient was brought to the CT Simulation planning suite.  Identity was confirmed.  All relevant records and images related to the planned course of therapy were reviewed.  The patient freely provided informed written consent to proceed with treatment after reviewing the details related to the planned course of therapy. The consent form was witnessed and verified by the simulation staff.  Then, the patient was set-up in a stable reproducible supine position for radiation therapy.  A vacuum lock pillow device was custom fabricated to position his legs in a reproducible immobilized position.  Then, I performed a urethrogram under sterile conditions to identify the prostatic apex.  CT images were obtained.  Surface markings were placed.  The CT images were loaded into the planning software.  Then the prostate target and avoidance structures including the rectum, bladder, bowel and hips were contoured.  Treatment planning then occurred.  The radiation prescription was entered and confirmed.  A total of one complex treatment devices was fabricated. I have requested : Intensity Modulated Radiotherapy (IMRT) is medically necessary for this case for the following reason:  Rectal sparing.Marland Kitchen  PLAN:   The prostate, seminal vesicles, and pelvic lymph nodes will initially be treated to 45 Gy in 25 fractions of 1.8 Gy followed by a boost to the prostate only, to 75 Gy with 15 additional fractions of 2.0 Gy; concurrent with LT-ADT (Orgovyx ADT started 12/30/2023.    ________________________________  Artist Pais. Kathrynn Running, M.D.

## 2024-02-24 ENCOUNTER — Ambulatory Visit
Admission: RE | Admit: 2024-02-24 | Discharge: 2024-02-24 | Disposition: A | Discharge status: Home / Self Care | Payer: Medicare PPO | Source: Ambulatory Visit | Attending: Radiation Oncology | Admitting: Radiation Oncology

## 2024-02-24 ENCOUNTER — Encounter (HOSPITAL_COMMUNITY): Payer: Self-pay | Admitting: Urology

## 2024-02-24 DIAGNOSIS — C61 Malignant neoplasm of prostate: Secondary | ICD-10-CM | POA: Insufficient documentation

## 2024-02-27 DIAGNOSIS — C61 Malignant neoplasm of prostate: Secondary | ICD-10-CM | POA: Diagnosis not present

## 2024-03-04 ENCOUNTER — Other Ambulatory Visit: Payer: Self-pay

## 2024-03-04 ENCOUNTER — Ambulatory Visit
Admission: RE | Admit: 2024-03-04 | Discharge: 2024-03-04 | Disposition: A | Discharge status: Home / Self Care | Source: Ambulatory Visit | Attending: Radiation Oncology | Admitting: Radiation Oncology

## 2024-03-04 DIAGNOSIS — C61 Malignant neoplasm of prostate: Secondary | ICD-10-CM | POA: Diagnosis present

## 2024-03-04 LAB — RAD ONC ARIA SESSION SUMMARY
Course Elapsed Days: 0
Plan Fractions Treated to Date: 1
Plan Prescribed Dose Per Fraction: 1.8 Gy
Plan Total Fractions Prescribed: 25
Plan Total Prescribed Dose: 45 Gy
Reference Point Dosage Given to Date: 1.8 Gy
Reference Point Session Dosage Given: 1.8 Gy
Session Number: 1

## 2024-03-05 ENCOUNTER — Other Ambulatory Visit: Payer: Self-pay

## 2024-03-05 ENCOUNTER — Ambulatory Visit
Admission: RE | Admit: 2024-03-05 | Discharge: 2024-03-05 | Disposition: A | Discharge status: Home / Self Care | Source: Ambulatory Visit | Attending: Radiation Oncology

## 2024-03-05 DIAGNOSIS — C61 Malignant neoplasm of prostate: Secondary | ICD-10-CM | POA: Diagnosis not present

## 2024-03-05 LAB — RAD ONC ARIA SESSION SUMMARY
Course Elapsed Days: 1
Plan Fractions Treated to Date: 2
Plan Prescribed Dose Per Fraction: 1.8 Gy
Plan Total Fractions Prescribed: 25
Plan Total Prescribed Dose: 45 Gy
Reference Point Dosage Given to Date: 3.6 Gy
Reference Point Session Dosage Given: 1.8 Gy
Session Number: 2

## 2024-03-06 ENCOUNTER — Ambulatory Visit
Admission: RE | Admit: 2024-03-06 | Discharge: 2024-03-06 | Disposition: A | Discharge status: Home / Self Care | Source: Ambulatory Visit | Attending: Radiation Oncology | Admitting: Radiation Oncology

## 2024-03-06 ENCOUNTER — Other Ambulatory Visit: Payer: Self-pay

## 2024-03-06 DIAGNOSIS — C61 Malignant neoplasm of prostate: Secondary | ICD-10-CM | POA: Diagnosis not present

## 2024-03-06 LAB — RAD ONC ARIA SESSION SUMMARY
Course Elapsed Days: 2
Plan Fractions Treated to Date: 3
Plan Prescribed Dose Per Fraction: 1.8 Gy
Plan Total Fractions Prescribed: 25
Plan Total Prescribed Dose: 45 Gy
Reference Point Dosage Given to Date: 5.4 Gy
Reference Point Session Dosage Given: 1.8 Gy
Session Number: 3

## 2024-03-09 ENCOUNTER — Other Ambulatory Visit: Payer: Self-pay

## 2024-03-09 ENCOUNTER — Ambulatory Visit
Admission: RE | Admit: 2024-03-09 | Discharge: 2024-03-09 | Disposition: A | Discharge status: Home / Self Care | Source: Ambulatory Visit | Attending: Radiation Oncology | Admitting: Radiation Oncology

## 2024-03-09 DIAGNOSIS — C61 Malignant neoplasm of prostate: Secondary | ICD-10-CM | POA: Diagnosis not present

## 2024-03-09 LAB — RAD ONC ARIA SESSION SUMMARY
Course Elapsed Days: 5
Plan Fractions Treated to Date: 4
Plan Prescribed Dose Per Fraction: 1.8 Gy
Plan Total Fractions Prescribed: 25
Plan Total Prescribed Dose: 45 Gy
Reference Point Dosage Given to Date: 7.2 Gy
Reference Point Session Dosage Given: 1.8 Gy
Session Number: 4

## 2024-03-10 ENCOUNTER — Other Ambulatory Visit: Payer: Self-pay

## 2024-03-10 ENCOUNTER — Ambulatory Visit
Admission: RE | Admit: 2024-03-10 | Discharge: 2024-03-10 | Disposition: A | Discharge status: Home / Self Care | Source: Ambulatory Visit | Attending: Radiation Oncology | Admitting: Radiation Oncology

## 2024-03-10 DIAGNOSIS — C61 Malignant neoplasm of prostate: Secondary | ICD-10-CM | POA: Diagnosis not present

## 2024-03-10 LAB — RAD ONC ARIA SESSION SUMMARY
Course Elapsed Days: 6
Plan Fractions Treated to Date: 5
Plan Prescribed Dose Per Fraction: 1.8 Gy
Plan Total Fractions Prescribed: 25
Plan Total Prescribed Dose: 45 Gy
Reference Point Dosage Given to Date: 9 Gy
Reference Point Session Dosage Given: 1.8 Gy
Session Number: 5

## 2024-03-11 ENCOUNTER — Ambulatory Visit
Admission: RE | Admit: 2024-03-11 | Discharge: 2024-03-11 | Disposition: A | Discharge status: Home / Self Care | Source: Ambulatory Visit | Attending: Radiation Oncology

## 2024-03-11 ENCOUNTER — Other Ambulatory Visit: Payer: Self-pay

## 2024-03-11 DIAGNOSIS — C61 Malignant neoplasm of prostate: Secondary | ICD-10-CM | POA: Diagnosis not present

## 2024-03-11 LAB — RAD ONC ARIA SESSION SUMMARY
Course Elapsed Days: 7
Plan Fractions Treated to Date: 6
Plan Prescribed Dose Per Fraction: 1.8 Gy
Plan Total Fractions Prescribed: 25
Plan Total Prescribed Dose: 45 Gy
Reference Point Dosage Given to Date: 10.8 Gy
Reference Point Session Dosage Given: 1.8 Gy
Session Number: 6

## 2024-03-12 ENCOUNTER — Ambulatory Visit
Admission: RE | Admit: 2024-03-12 | Discharge: 2024-03-12 | Disposition: A | Discharge status: Home / Self Care | Source: Ambulatory Visit | Attending: Radiation Oncology | Admitting: Radiation Oncology

## 2024-03-12 ENCOUNTER — Other Ambulatory Visit: Payer: Self-pay

## 2024-03-12 DIAGNOSIS — C61 Malignant neoplasm of prostate: Secondary | ICD-10-CM | POA: Diagnosis not present

## 2024-03-12 LAB — RAD ONC ARIA SESSION SUMMARY
Course Elapsed Days: 8
Plan Fractions Treated to Date: 7
Plan Prescribed Dose Per Fraction: 1.8 Gy
Plan Total Fractions Prescribed: 25
Plan Total Prescribed Dose: 45 Gy
Reference Point Dosage Given to Date: 12.6 Gy
Reference Point Session Dosage Given: 1.8 Gy
Session Number: 7

## 2024-03-13 ENCOUNTER — Ambulatory Visit
Admission: RE | Admit: 2024-03-13 | Discharge: 2024-03-13 | Disposition: A | Discharge status: Home / Self Care | Source: Ambulatory Visit | Attending: Radiation Oncology | Admitting: Radiation Oncology

## 2024-03-13 ENCOUNTER — Other Ambulatory Visit: Payer: Self-pay

## 2024-03-13 DIAGNOSIS — C61 Malignant neoplasm of prostate: Secondary | ICD-10-CM | POA: Diagnosis not present

## 2024-03-13 LAB — RAD ONC ARIA SESSION SUMMARY
Course Elapsed Days: 9
Plan Fractions Treated to Date: 8
Plan Prescribed Dose Per Fraction: 1.8 Gy
Plan Total Fractions Prescribed: 25
Plan Total Prescribed Dose: 45 Gy
Reference Point Dosage Given to Date: 14.4 Gy
Reference Point Session Dosage Given: 1.8 Gy
Session Number: 8

## 2024-03-16 ENCOUNTER — Other Ambulatory Visit: Payer: Self-pay

## 2024-03-16 ENCOUNTER — Ambulatory Visit
Admission: RE | Admit: 2024-03-16 | Discharge: 2024-03-16 | Disposition: A | Discharge status: Home / Self Care | Source: Ambulatory Visit | Attending: Radiation Oncology | Admitting: Radiation Oncology

## 2024-03-16 DIAGNOSIS — C61 Malignant neoplasm of prostate: Secondary | ICD-10-CM | POA: Diagnosis not present

## 2024-03-16 LAB — RAD ONC ARIA SESSION SUMMARY
Course Elapsed Days: 12
Plan Fractions Treated to Date: 9
Plan Prescribed Dose Per Fraction: 1.8 Gy
Plan Total Fractions Prescribed: 25
Plan Total Prescribed Dose: 45 Gy
Reference Point Dosage Given to Date: 16.2 Gy
Reference Point Session Dosage Given: 1.8 Gy
Session Number: 9

## 2024-03-17 ENCOUNTER — Other Ambulatory Visit: Payer: Self-pay

## 2024-03-17 ENCOUNTER — Ambulatory Visit
Admission: RE | Admit: 2024-03-17 | Discharge: 2024-03-17 | Disposition: A | Discharge status: Home / Self Care | Source: Ambulatory Visit | Attending: Radiation Oncology | Admitting: Radiation Oncology

## 2024-03-17 DIAGNOSIS — C61 Malignant neoplasm of prostate: Secondary | ICD-10-CM | POA: Diagnosis not present

## 2024-03-17 LAB — RAD ONC ARIA SESSION SUMMARY
Course Elapsed Days: 13
Plan Fractions Treated to Date: 10
Plan Prescribed Dose Per Fraction: 1.8 Gy
Plan Total Fractions Prescribed: 25
Plan Total Prescribed Dose: 45 Gy
Reference Point Dosage Given to Date: 18 Gy
Reference Point Session Dosage Given: 1.8 Gy
Session Number: 10

## 2024-03-18 ENCOUNTER — Ambulatory Visit
Admission: RE | Admit: 2024-03-18 | Discharge: 2024-03-18 | Disposition: A | Discharge status: Home / Self Care | Source: Ambulatory Visit | Attending: Radiation Oncology | Admitting: Radiation Oncology

## 2024-03-18 ENCOUNTER — Other Ambulatory Visit: Payer: Self-pay

## 2024-03-18 DIAGNOSIS — C61 Malignant neoplasm of prostate: Secondary | ICD-10-CM | POA: Diagnosis not present

## 2024-03-18 LAB — RAD ONC ARIA SESSION SUMMARY
Course Elapsed Days: 14
Plan Fractions Treated to Date: 11
Plan Prescribed Dose Per Fraction: 1.8 Gy
Plan Total Fractions Prescribed: 25
Plan Total Prescribed Dose: 45 Gy
Reference Point Dosage Given to Date: 19.8 Gy
Reference Point Session Dosage Given: 1.8 Gy
Session Number: 11

## 2024-03-19 ENCOUNTER — Other Ambulatory Visit: Payer: Self-pay

## 2024-03-19 ENCOUNTER — Ambulatory Visit
Admission: RE | Admit: 2024-03-19 | Discharge: 2024-03-19 | Disposition: A | Discharge status: Home / Self Care | Source: Ambulatory Visit | Attending: Radiation Oncology | Admitting: Radiation Oncology

## 2024-03-19 DIAGNOSIS — C61 Malignant neoplasm of prostate: Secondary | ICD-10-CM | POA: Diagnosis not present

## 2024-03-19 LAB — RAD ONC ARIA SESSION SUMMARY
Course Elapsed Days: 15
Plan Fractions Treated to Date: 12
Plan Prescribed Dose Per Fraction: 1.8 Gy
Plan Total Fractions Prescribed: 25
Plan Total Prescribed Dose: 45 Gy
Reference Point Dosage Given to Date: 21.6 Gy
Reference Point Session Dosage Given: 1.8 Gy
Session Number: 12

## 2024-03-20 ENCOUNTER — Ambulatory Visit
Admission: RE | Admit: 2024-03-20 | Discharge: 2024-03-20 | Disposition: A | Discharge status: Home / Self Care | Source: Ambulatory Visit | Attending: Radiation Oncology | Admitting: Radiation Oncology

## 2024-03-20 ENCOUNTER — Other Ambulatory Visit: Payer: Self-pay

## 2024-03-20 DIAGNOSIS — C61 Malignant neoplasm of prostate: Secondary | ICD-10-CM | POA: Diagnosis not present

## 2024-03-20 LAB — RAD ONC ARIA SESSION SUMMARY
Course Elapsed Days: 16
Plan Fractions Treated to Date: 13
Plan Prescribed Dose Per Fraction: 1.8 Gy
Plan Total Fractions Prescribed: 25
Plan Total Prescribed Dose: 45 Gy
Reference Point Dosage Given to Date: 23.4 Gy
Reference Point Session Dosage Given: 1.8 Gy
Session Number: 13

## 2024-03-23 ENCOUNTER — Ambulatory Visit
Admission: RE | Admit: 2024-03-23 | Discharge: 2024-03-23 | Disposition: A | Discharge status: Home / Self Care | Source: Ambulatory Visit | Attending: Radiation Oncology | Admitting: Radiation Oncology

## 2024-03-23 ENCOUNTER — Other Ambulatory Visit: Payer: Self-pay

## 2024-03-23 DIAGNOSIS — C61 Malignant neoplasm of prostate: Secondary | ICD-10-CM | POA: Diagnosis not present

## 2024-03-23 LAB — RAD ONC ARIA SESSION SUMMARY
Course Elapsed Days: 19
Plan Fractions Treated to Date: 14
Plan Prescribed Dose Per Fraction: 1.8 Gy
Plan Total Fractions Prescribed: 25
Plan Total Prescribed Dose: 45 Gy
Reference Point Dosage Given to Date: 25.2 Gy
Reference Point Session Dosage Given: 1.8 Gy
Session Number: 14

## 2024-03-24 ENCOUNTER — Other Ambulatory Visit: Payer: Self-pay

## 2024-03-24 ENCOUNTER — Ambulatory Visit
Admission: RE | Admit: 2024-03-24 | Discharge: 2024-03-24 | Disposition: A | Discharge status: Home / Self Care | Source: Ambulatory Visit | Attending: Radiation Oncology | Admitting: Radiation Oncology

## 2024-03-24 DIAGNOSIS — C61 Malignant neoplasm of prostate: Secondary | ICD-10-CM | POA: Diagnosis not present

## 2024-03-24 LAB — RAD ONC ARIA SESSION SUMMARY
Course Elapsed Days: 20
Plan Fractions Treated to Date: 15
Plan Prescribed Dose Per Fraction: 1.8 Gy
Plan Total Fractions Prescribed: 25
Plan Total Prescribed Dose: 45 Gy
Reference Point Dosage Given to Date: 27 Gy
Reference Point Session Dosage Given: 1.8 Gy
Session Number: 15

## 2024-03-25 ENCOUNTER — Other Ambulatory Visit: Payer: Self-pay

## 2024-03-25 ENCOUNTER — Ambulatory Visit
Admission: RE | Admit: 2024-03-25 | Discharge: 2024-03-25 | Disposition: A | Discharge status: Home / Self Care | Source: Ambulatory Visit | Attending: Radiation Oncology | Admitting: Radiation Oncology

## 2024-03-25 DIAGNOSIS — C61 Malignant neoplasm of prostate: Secondary | ICD-10-CM | POA: Diagnosis not present

## 2024-03-25 LAB — RAD ONC ARIA SESSION SUMMARY
Course Elapsed Days: 21
Plan Fractions Treated to Date: 16
Plan Prescribed Dose Per Fraction: 1.8 Gy
Plan Total Fractions Prescribed: 25
Plan Total Prescribed Dose: 45 Gy
Reference Point Dosage Given to Date: 28.8 Gy
Reference Point Session Dosage Given: 1.8 Gy
Session Number: 16

## 2024-03-26 ENCOUNTER — Ambulatory Visit
Admission: RE | Admit: 2024-03-26 | Discharge: 2024-03-26 | Disposition: A | Discharge status: Home / Self Care | Source: Ambulatory Visit | Attending: Radiation Oncology

## 2024-03-26 ENCOUNTER — Other Ambulatory Visit: Payer: Self-pay

## 2024-03-26 DIAGNOSIS — C61 Malignant neoplasm of prostate: Secondary | ICD-10-CM | POA: Diagnosis not present

## 2024-03-26 LAB — RAD ONC ARIA SESSION SUMMARY
Course Elapsed Days: 22
Plan Fractions Treated to Date: 17
Plan Prescribed Dose Per Fraction: 1.8 Gy
Plan Total Fractions Prescribed: 25
Plan Total Prescribed Dose: 45 Gy
Reference Point Dosage Given to Date: 30.6 Gy
Reference Point Session Dosage Given: 1.8 Gy
Session Number: 17

## 2024-03-27 ENCOUNTER — Ambulatory Visit
Admission: RE | Admit: 2024-03-27 | Discharge: 2024-03-27 | Disposition: A | Discharge status: Home / Self Care | Source: Ambulatory Visit | Attending: Radiation Oncology | Admitting: Radiation Oncology

## 2024-03-27 ENCOUNTER — Other Ambulatory Visit: Payer: Self-pay

## 2024-03-27 DIAGNOSIS — C61 Malignant neoplasm of prostate: Secondary | ICD-10-CM | POA: Diagnosis not present

## 2024-03-27 LAB — RAD ONC ARIA SESSION SUMMARY
Course Elapsed Days: 23
Plan Fractions Treated to Date: 18
Plan Prescribed Dose Per Fraction: 1.8 Gy
Plan Total Fractions Prescribed: 25
Plan Total Prescribed Dose: 45 Gy
Reference Point Dosage Given to Date: 32.4 Gy
Reference Point Session Dosage Given: 1.8 Gy
Session Number: 18

## 2024-03-30 ENCOUNTER — Other Ambulatory Visit: Payer: Self-pay

## 2024-03-30 ENCOUNTER — Ambulatory Visit
Admission: RE | Admit: 2024-03-30 | Discharge: 2024-03-30 | Disposition: A | Discharge status: Home / Self Care | Source: Ambulatory Visit | Attending: Radiation Oncology

## 2024-03-30 DIAGNOSIS — C61 Malignant neoplasm of prostate: Secondary | ICD-10-CM | POA: Diagnosis not present

## 2024-03-30 LAB — RAD ONC ARIA SESSION SUMMARY
Course Elapsed Days: 26
Plan Fractions Treated to Date: 19
Plan Prescribed Dose Per Fraction: 1.8 Gy
Plan Total Fractions Prescribed: 25
Plan Total Prescribed Dose: 45 Gy
Reference Point Dosage Given to Date: 34.2 Gy
Reference Point Session Dosage Given: 1.8 Gy
Session Number: 19

## 2024-03-31 ENCOUNTER — Ambulatory Visit
Admission: RE | Admit: 2024-03-31 | Discharge: 2024-03-31 | Disposition: A | Discharge status: Home / Self Care | Source: Ambulatory Visit | Attending: Radiation Oncology | Admitting: Radiation Oncology

## 2024-03-31 ENCOUNTER — Other Ambulatory Visit: Payer: Self-pay

## 2024-03-31 DIAGNOSIS — C61 Malignant neoplasm of prostate: Secondary | ICD-10-CM | POA: Diagnosis not present

## 2024-03-31 LAB — RAD ONC ARIA SESSION SUMMARY
Course Elapsed Days: 27
Plan Fractions Treated to Date: 20
Plan Prescribed Dose Per Fraction: 1.8 Gy
Plan Total Fractions Prescribed: 25
Plan Total Prescribed Dose: 45 Gy
Reference Point Dosage Given to Date: 36 Gy
Reference Point Session Dosage Given: 1.8 Gy
Session Number: 20

## 2024-04-01 ENCOUNTER — Ambulatory Visit
Admission: RE | Admit: 2024-04-01 | Discharge: 2024-04-01 | Disposition: A | Discharge status: Home / Self Care | Source: Ambulatory Visit | Attending: Radiation Oncology | Admitting: Radiation Oncology

## 2024-04-01 ENCOUNTER — Other Ambulatory Visit: Payer: Self-pay

## 2024-04-01 DIAGNOSIS — C61 Malignant neoplasm of prostate: Secondary | ICD-10-CM | POA: Diagnosis not present

## 2024-04-01 LAB — RAD ONC ARIA SESSION SUMMARY
Course Elapsed Days: 28
Plan Fractions Treated to Date: 21
Plan Prescribed Dose Per Fraction: 1.8 Gy
Plan Total Fractions Prescribed: 25
Plan Total Prescribed Dose: 45 Gy
Reference Point Dosage Given to Date: 37.8 Gy
Reference Point Session Dosage Given: 1.8 Gy
Session Number: 21

## 2024-04-02 ENCOUNTER — Other Ambulatory Visit: Payer: Self-pay

## 2024-04-02 ENCOUNTER — Ambulatory Visit
Admission: RE | Admit: 2024-04-02 | Discharge: 2024-04-02 | Disposition: A | Discharge status: Home / Self Care | Source: Ambulatory Visit | Attending: Radiation Oncology | Admitting: Radiation Oncology

## 2024-04-02 DIAGNOSIS — C61 Malignant neoplasm of prostate: Secondary | ICD-10-CM | POA: Insufficient documentation

## 2024-04-02 DIAGNOSIS — Z510 Encounter for antineoplastic radiation therapy: Secondary | ICD-10-CM | POA: Diagnosis present

## 2024-04-02 LAB — RAD ONC ARIA SESSION SUMMARY
Course Elapsed Days: 29
Plan Fractions Treated to Date: 22
Plan Prescribed Dose Per Fraction: 1.8 Gy
Plan Total Fractions Prescribed: 25
Plan Total Prescribed Dose: 45 Gy
Reference Point Dosage Given to Date: 39.6 Gy
Reference Point Session Dosage Given: 1.8 Gy
Session Number: 22

## 2024-04-03 ENCOUNTER — Ambulatory Visit
Admission: RE | Admit: 2024-04-03 | Discharge: 2024-04-03 | Disposition: A | Discharge status: Home / Self Care | Source: Ambulatory Visit | Attending: Radiation Oncology | Admitting: Radiation Oncology

## 2024-04-03 ENCOUNTER — Other Ambulatory Visit: Payer: Self-pay

## 2024-04-03 ENCOUNTER — Ambulatory Visit
Admission: RE | Admit: 2024-04-03 | Discharge: 2024-04-03 | Disposition: A | Discharge status: Home / Self Care | Source: Ambulatory Visit | Attending: Radiation Oncology

## 2024-04-03 DIAGNOSIS — Z510 Encounter for antineoplastic radiation therapy: Secondary | ICD-10-CM | POA: Diagnosis not present

## 2024-04-03 LAB — RAD ONC ARIA SESSION SUMMARY
Course Elapsed Days: 30
Plan Fractions Treated to Date: 23
Plan Prescribed Dose Per Fraction: 1.8 Gy
Plan Total Fractions Prescribed: 25
Plan Total Prescribed Dose: 45 Gy
Reference Point Dosage Given to Date: 41.4 Gy
Reference Point Session Dosage Given: 1.8 Gy
Session Number: 23

## 2024-04-06 ENCOUNTER — Other Ambulatory Visit: Payer: Self-pay

## 2024-04-06 ENCOUNTER — Ambulatory Visit
Admission: RE | Admit: 2024-04-06 | Discharge: 2024-04-06 | Disposition: A | Discharge status: Home / Self Care | Source: Ambulatory Visit | Attending: Radiation Oncology

## 2024-04-06 DIAGNOSIS — Z510 Encounter for antineoplastic radiation therapy: Secondary | ICD-10-CM | POA: Diagnosis not present

## 2024-04-06 LAB — RAD ONC ARIA SESSION SUMMARY
Course Elapsed Days: 33
Plan Fractions Treated to Date: 24
Plan Prescribed Dose Per Fraction: 1.8 Gy
Plan Total Fractions Prescribed: 25
Plan Total Prescribed Dose: 45 Gy
Reference Point Dosage Given to Date: 43.2 Gy
Reference Point Session Dosage Given: 1.8 Gy
Session Number: 24

## 2024-04-07 ENCOUNTER — Ambulatory Visit
Admission: RE | Admit: 2024-04-07 | Discharge: 2024-04-07 | Disposition: A | Discharge status: Home / Self Care | Source: Ambulatory Visit | Attending: Radiation Oncology

## 2024-04-07 ENCOUNTER — Other Ambulatory Visit: Payer: Self-pay

## 2024-04-07 DIAGNOSIS — Z510 Encounter for antineoplastic radiation therapy: Secondary | ICD-10-CM | POA: Diagnosis not present

## 2024-04-07 LAB — RAD ONC ARIA SESSION SUMMARY
Course Elapsed Days: 34
Plan Fractions Treated to Date: 25
Plan Prescribed Dose Per Fraction: 1.8 Gy
Plan Total Fractions Prescribed: 25
Plan Total Prescribed Dose: 45 Gy
Reference Point Dosage Given to Date: 45 Gy
Reference Point Session Dosage Given: 1.8 Gy
Session Number: 25

## 2024-04-08 ENCOUNTER — Other Ambulatory Visit: Payer: Self-pay

## 2024-04-08 ENCOUNTER — Ambulatory Visit
Admission: RE | Admit: 2024-04-08 | Discharge: 2024-04-08 | Disposition: A | Discharge status: Home / Self Care | Source: Ambulatory Visit | Attending: Radiation Oncology

## 2024-04-08 DIAGNOSIS — Z510 Encounter for antineoplastic radiation therapy: Secondary | ICD-10-CM | POA: Diagnosis not present

## 2024-04-08 LAB — RAD ONC ARIA SESSION SUMMARY
Course Elapsed Days: 35
Plan Fractions Treated to Date: 1
Plan Prescribed Dose Per Fraction: 2 Gy
Plan Total Fractions Prescribed: 15
Plan Total Prescribed Dose: 30 Gy
Reference Point Dosage Given to Date: 2 Gy
Reference Point Session Dosage Given: 2 Gy
Session Number: 26

## 2024-04-09 ENCOUNTER — Ambulatory Visit
Admission: RE | Admit: 2024-04-09 | Discharge: 2024-04-09 | Disposition: A | Discharge status: Home / Self Care | Source: Ambulatory Visit | Attending: Radiation Oncology | Admitting: Radiation Oncology

## 2024-04-09 ENCOUNTER — Other Ambulatory Visit: Payer: Self-pay

## 2024-04-09 DIAGNOSIS — Z510 Encounter for antineoplastic radiation therapy: Secondary | ICD-10-CM | POA: Diagnosis not present

## 2024-04-09 LAB — RAD ONC ARIA SESSION SUMMARY
Course Elapsed Days: 36
Plan Fractions Treated to Date: 2
Plan Prescribed Dose Per Fraction: 2 Gy
Plan Total Fractions Prescribed: 15
Plan Total Prescribed Dose: 30 Gy
Reference Point Dosage Given to Date: 4 Gy
Reference Point Session Dosage Given: 2 Gy
Session Number: 27

## 2024-04-10 ENCOUNTER — Other Ambulatory Visit: Payer: Self-pay

## 2024-04-10 ENCOUNTER — Ambulatory Visit
Admission: RE | Admit: 2024-04-10 | Discharge: 2024-04-10 | Disposition: A | Discharge status: Home / Self Care | Source: Ambulatory Visit | Attending: Radiation Oncology | Admitting: Radiation Oncology

## 2024-04-10 DIAGNOSIS — Z510 Encounter for antineoplastic radiation therapy: Secondary | ICD-10-CM | POA: Diagnosis not present

## 2024-04-10 LAB — RAD ONC ARIA SESSION SUMMARY
Course Elapsed Days: 37
Plan Fractions Treated to Date: 3
Plan Prescribed Dose Per Fraction: 2 Gy
Plan Total Fractions Prescribed: 15
Plan Total Prescribed Dose: 30 Gy
Reference Point Dosage Given to Date: 6 Gy
Reference Point Session Dosage Given: 2 Gy
Session Number: 28

## 2024-04-13 ENCOUNTER — Ambulatory Visit
Admission: RE | Admit: 2024-04-13 | Discharge: 2024-04-13 | Disposition: A | Discharge status: Home / Self Care | Source: Ambulatory Visit | Attending: Radiation Oncology | Admitting: Radiation Oncology

## 2024-04-13 ENCOUNTER — Other Ambulatory Visit: Payer: Self-pay

## 2024-04-13 DIAGNOSIS — Z510 Encounter for antineoplastic radiation therapy: Secondary | ICD-10-CM | POA: Diagnosis not present

## 2024-04-13 LAB — RAD ONC ARIA SESSION SUMMARY
Course Elapsed Days: 40
Plan Fractions Treated to Date: 4
Plan Prescribed Dose Per Fraction: 2 Gy
Plan Total Fractions Prescribed: 15
Plan Total Prescribed Dose: 30 Gy
Reference Point Dosage Given to Date: 8 Gy
Reference Point Session Dosage Given: 2 Gy
Session Number: 29

## 2024-04-14 ENCOUNTER — Other Ambulatory Visit: Payer: Self-pay

## 2024-04-14 ENCOUNTER — Ambulatory Visit
Admission: RE | Admit: 2024-04-14 | Discharge: 2024-04-14 | Disposition: A | Discharge status: Home / Self Care | Source: Ambulatory Visit | Attending: Radiation Oncology

## 2024-04-14 DIAGNOSIS — Z510 Encounter for antineoplastic radiation therapy: Secondary | ICD-10-CM | POA: Diagnosis not present

## 2024-04-14 LAB — RAD ONC ARIA SESSION SUMMARY
Course Elapsed Days: 41
Plan Fractions Treated to Date: 5
Plan Prescribed Dose Per Fraction: 2 Gy
Plan Total Fractions Prescribed: 15
Plan Total Prescribed Dose: 30 Gy
Reference Point Dosage Given to Date: 10 Gy
Reference Point Session Dosage Given: 2 Gy
Session Number: 30

## 2024-04-15 ENCOUNTER — Ambulatory Visit
Admission: RE | Admit: 2024-04-15 | Discharge: 2024-04-15 | Disposition: A | Discharge status: Home / Self Care | Source: Ambulatory Visit | Attending: Radiation Oncology

## 2024-04-15 ENCOUNTER — Other Ambulatory Visit: Payer: Self-pay

## 2024-04-15 DIAGNOSIS — Z510 Encounter for antineoplastic radiation therapy: Secondary | ICD-10-CM | POA: Diagnosis not present

## 2024-04-15 LAB — RAD ONC ARIA SESSION SUMMARY
Course Elapsed Days: 42
Plan Fractions Treated to Date: 6
Plan Prescribed Dose Per Fraction: 2 Gy
Plan Total Fractions Prescribed: 15
Plan Total Prescribed Dose: 30 Gy
Reference Point Dosage Given to Date: 12 Gy
Reference Point Session Dosage Given: 2 Gy
Session Number: 31

## 2024-04-16 ENCOUNTER — Ambulatory Visit
Admission: RE | Admit: 2024-04-16 | Discharge: 2024-04-16 | Disposition: A | Discharge status: Home / Self Care | Source: Ambulatory Visit | Attending: Radiation Oncology

## 2024-04-16 ENCOUNTER — Other Ambulatory Visit: Payer: Self-pay

## 2024-04-16 DIAGNOSIS — Z510 Encounter for antineoplastic radiation therapy: Secondary | ICD-10-CM | POA: Diagnosis not present

## 2024-04-16 LAB — RAD ONC ARIA SESSION SUMMARY
Course Elapsed Days: 43
Plan Fractions Treated to Date: 7
Plan Prescribed Dose Per Fraction: 2 Gy
Plan Total Fractions Prescribed: 15
Plan Total Prescribed Dose: 30 Gy
Reference Point Dosage Given to Date: 14 Gy
Reference Point Session Dosage Given: 2 Gy
Session Number: 32

## 2024-04-17 ENCOUNTER — Ambulatory Visit

## 2024-04-20 ENCOUNTER — Other Ambulatory Visit: Payer: Self-pay

## 2024-04-20 ENCOUNTER — Ambulatory Visit
Admission: RE | Admit: 2024-04-20 | Discharge: 2024-04-20 | Disposition: A | Discharge status: Home / Self Care | Source: Ambulatory Visit | Attending: Radiation Oncology | Admitting: Radiation Oncology

## 2024-04-20 DIAGNOSIS — Z510 Encounter for antineoplastic radiation therapy: Secondary | ICD-10-CM | POA: Diagnosis not present

## 2024-04-20 LAB — RAD ONC ARIA SESSION SUMMARY
Course Elapsed Days: 47
Plan Fractions Treated to Date: 8
Plan Prescribed Dose Per Fraction: 2 Gy
Plan Total Fractions Prescribed: 15
Plan Total Prescribed Dose: 30 Gy
Reference Point Dosage Given to Date: 16 Gy
Reference Point Session Dosage Given: 2 Gy
Session Number: 33

## 2024-04-21 ENCOUNTER — Ambulatory Visit
Admission: RE | Admit: 2024-04-21 | Discharge: 2024-04-21 | Disposition: A | Discharge status: Home / Self Care | Source: Ambulatory Visit | Attending: Radiation Oncology | Admitting: Radiation Oncology

## 2024-04-21 ENCOUNTER — Other Ambulatory Visit: Payer: Self-pay

## 2024-04-21 DIAGNOSIS — Z510 Encounter for antineoplastic radiation therapy: Secondary | ICD-10-CM | POA: Diagnosis not present

## 2024-04-21 LAB — RAD ONC ARIA SESSION SUMMARY
Course Elapsed Days: 48
Plan Fractions Treated to Date: 9
Plan Prescribed Dose Per Fraction: 2 Gy
Plan Total Fractions Prescribed: 15
Plan Total Prescribed Dose: 30 Gy
Reference Point Dosage Given to Date: 18 Gy
Reference Point Session Dosage Given: 2 Gy
Session Number: 34

## 2024-04-22 ENCOUNTER — Other Ambulatory Visit: Payer: Self-pay

## 2024-04-22 ENCOUNTER — Ambulatory Visit
Admission: RE | Admit: 2024-04-22 | Discharge: 2024-04-22 | Disposition: A | Discharge status: Home / Self Care | Source: Ambulatory Visit | Attending: Radiation Oncology | Admitting: Radiation Oncology

## 2024-04-22 DIAGNOSIS — Z510 Encounter for antineoplastic radiation therapy: Secondary | ICD-10-CM | POA: Diagnosis not present

## 2024-04-22 LAB — RAD ONC ARIA SESSION SUMMARY
Course Elapsed Days: 49
Plan Fractions Treated to Date: 10
Plan Prescribed Dose Per Fraction: 2 Gy
Plan Total Fractions Prescribed: 15
Plan Total Prescribed Dose: 30 Gy
Reference Point Dosage Given to Date: 20 Gy
Reference Point Session Dosage Given: 2 Gy
Session Number: 35

## 2024-04-23 ENCOUNTER — Ambulatory Visit
Admission: RE | Admit: 2024-04-23 | Discharge: 2024-04-23 | Disposition: A | Discharge status: Home / Self Care | Source: Ambulatory Visit | Attending: Radiation Oncology | Admitting: Radiation Oncology

## 2024-04-23 ENCOUNTER — Other Ambulatory Visit: Payer: Self-pay

## 2024-04-23 DIAGNOSIS — Z510 Encounter for antineoplastic radiation therapy: Secondary | ICD-10-CM | POA: Diagnosis not present

## 2024-04-23 LAB — RAD ONC ARIA SESSION SUMMARY
Course Elapsed Days: 50
Plan Fractions Treated to Date: 11
Plan Prescribed Dose Per Fraction: 2 Gy
Plan Total Fractions Prescribed: 15
Plan Total Prescribed Dose: 30 Gy
Reference Point Dosage Given to Date: 22 Gy
Reference Point Session Dosage Given: 2 Gy
Session Number: 36

## 2024-04-24 ENCOUNTER — Ambulatory Visit
Admission: RE | Admit: 2024-04-24 | Discharge: 2024-04-24 | Disposition: A | Discharge status: Home / Self Care | Source: Ambulatory Visit | Attending: Radiation Oncology | Admitting: Radiation Oncology

## 2024-04-24 ENCOUNTER — Other Ambulatory Visit: Payer: Self-pay

## 2024-04-24 ENCOUNTER — Ambulatory Visit

## 2024-04-24 DIAGNOSIS — Z510 Encounter for antineoplastic radiation therapy: Secondary | ICD-10-CM | POA: Diagnosis not present

## 2024-04-24 LAB — RAD ONC ARIA SESSION SUMMARY
Course Elapsed Days: 51
Plan Fractions Treated to Date: 12
Plan Prescribed Dose Per Fraction: 2 Gy
Plan Total Fractions Prescribed: 15
Plan Total Prescribed Dose: 30 Gy
Reference Point Dosage Given to Date: 24 Gy
Reference Point Session Dosage Given: 2 Gy
Session Number: 37

## 2024-04-28 ENCOUNTER — Other Ambulatory Visit: Payer: Self-pay

## 2024-04-28 ENCOUNTER — Ambulatory Visit
Admission: RE | Admit: 2024-04-28 | Discharge: 2024-04-28 | Disposition: A | Discharge status: Home / Self Care | Source: Ambulatory Visit | Attending: Radiation Oncology | Admitting: Radiation Oncology

## 2024-04-28 DIAGNOSIS — Z510 Encounter for antineoplastic radiation therapy: Secondary | ICD-10-CM | POA: Diagnosis not present

## 2024-04-28 LAB — RAD ONC ARIA SESSION SUMMARY
Course Elapsed Days: 55
Plan Fractions Treated to Date: 13
Plan Prescribed Dose Per Fraction: 2 Gy
Plan Total Fractions Prescribed: 15
Plan Total Prescribed Dose: 30 Gy
Reference Point Dosage Given to Date: 26 Gy
Reference Point Session Dosage Given: 2 Gy
Session Number: 38

## 2024-04-29 ENCOUNTER — Ambulatory Visit

## 2024-04-29 ENCOUNTER — Ambulatory Visit
Admission: RE | Admit: 2024-04-29 | Discharge: 2024-04-29 | Disposition: A | Discharge status: Home / Self Care | Source: Ambulatory Visit | Attending: Radiation Oncology | Admitting: Radiation Oncology

## 2024-04-29 ENCOUNTER — Other Ambulatory Visit: Payer: Self-pay

## 2024-04-29 DIAGNOSIS — Z510 Encounter for antineoplastic radiation therapy: Secondary | ICD-10-CM | POA: Diagnosis not present

## 2024-04-29 LAB — RAD ONC ARIA SESSION SUMMARY
Course Elapsed Days: 56
Plan Fractions Treated to Date: 14
Plan Prescribed Dose Per Fraction: 2 Gy
Plan Total Fractions Prescribed: 15
Plan Total Prescribed Dose: 30 Gy
Reference Point Dosage Given to Date: 28 Gy
Reference Point Session Dosage Given: 2 Gy
Session Number: 39

## 2024-04-30 ENCOUNTER — Ambulatory Visit
Admission: RE | Admit: 2024-04-30 | Discharge: 2024-04-30 | Disposition: A | Discharge status: Home / Self Care | Source: Ambulatory Visit | Attending: Radiation Oncology | Admitting: Radiation Oncology

## 2024-04-30 ENCOUNTER — Other Ambulatory Visit: Payer: Self-pay

## 2024-04-30 DIAGNOSIS — C61 Malignant neoplasm of prostate: Secondary | ICD-10-CM

## 2024-04-30 DIAGNOSIS — Z510 Encounter for antineoplastic radiation therapy: Secondary | ICD-10-CM | POA: Diagnosis not present

## 2024-04-30 LAB — RAD ONC ARIA SESSION SUMMARY
Course Elapsed Days: 57
Plan Fractions Treated to Date: 15
Plan Prescribed Dose Per Fraction: 2 Gy
Plan Total Fractions Prescribed: 15
Plan Total Prescribed Dose: 30 Gy
Reference Point Dosage Given to Date: 30 Gy
Reference Point Session Dosage Given: 2 Gy
Session Number: 40

## 2024-04-30 NOTE — Progress Notes (Signed)
 Patient was a RadOnc Consult on 01/06/24 for his stage T1c adenocarcinoma of the prostate with Gleason score of 4+5, and PSA of 10.8 (adjusted for finasteride )   Patient proceed with treatment recommendations of LT-ADT and 8 weeks of daily radiation and had his final radiation treatment on 04/2258.   Patient is scheduled for a post treatment nurse call on 05/26/24 and has active follow up's with urology.

## 2024-05-01 NOTE — Radiation Completion Notes (Addendum)
  Radiation Oncology         (336) [phone] ________________________________  Name: Luke Becker MRN: [id number]  Date: 04/30/2024  DOB: 28-Nov-1946  Referring Physician: Doy Gene, M.D. Date of Service: 2024-05-01 Radiation Oncologist: Bartholome Ligas, M.D. [facility]     RADIATION ONCOLOGY END OF TREATMENT NOTE     Diagnosis: 78 y.o. gentleman with Stage T1c adenocarcinoma of the prostate with Gleason score of 4+5, and PSA of 10.8 (adjusted for finasteride ).   Intent: Curative     ==========DELIVERED PLANS==========  First Treatment Date: 2024-03-04 Last Treatment Date: 2024-04-30   Plan Name: Prostate_Pelv Site: Prostate Technique: IMRT Mode: Photon Dose Per Fraction: 1.8 Gy Prescribed Dose (Delivered / Prescribed): 45 Gy / 45 Gy Prescribed Fxs (Delivered / Prescribed): 25 / 25   Plan Name: Prostate_Bst Site: Prostate Technique: IMRT Mode: Photon Dose Per Fraction: 2 Gy Prescribed Dose (Delivered / Prescribed): 30 Gy / 30 Gy Prescribed Fxs (Delivered / Prescribed): 15 / 15     ==========ON TREATMENT VISIT DATES========== 2024-03-06, 2024-03-13, 2024-03-20, 2024-03-27, 2024-04-03, 2024-04-09, 2024-04-21, 2024-04-30    See weekly On Treatment Notes in Epic for details in the Media tab (listed as Progress notes on the On Treatment Visit Dates listed above).  He tolerated the daily treatments very well with only modest fatigue.  He continued with no difficulty self catheterizing throughout the course of treatment.  The patient will receive a call in about one month from the radiation oncology department. He will continue follow up with his urologist, Dr. Freddi Jaeger, as well.  ------------------------------------------------   Kenith Payer, MD [facility]  Radiation Oncology Direct Dial: [phone]  Fax: [phone] .com  Skype  LinkedIn

## 2024-05-15 ENCOUNTER — Other Ambulatory Visit: Payer: Self-pay | Admitting: Urology

## 2024-05-15 DIAGNOSIS — C61 Malignant neoplasm of prostate: Secondary | ICD-10-CM

## 2024-05-19 ENCOUNTER — Telehealth: Payer: Self-pay | Admitting: Radiation Oncology

## 2024-05-19 NOTE — Telephone Encounter (Signed)
 6/17 patient called to confirm his post treatment call appt for 6/24.

## 2024-05-26 ENCOUNTER — Ambulatory Visit
Admission: RE | Admit: 2024-05-26 | Discharge: 2024-05-26 | Disposition: A | Discharge status: Home / Self Care | Source: Ambulatory Visit | Attending: Internal Medicine | Admitting: Internal Medicine

## 2024-05-26 DIAGNOSIS — Z510 Encounter for antineoplastic radiation therapy: Secondary | ICD-10-CM | POA: Insufficient documentation

## 2024-05-26 DIAGNOSIS — C61 Malignant neoplasm of prostate: Secondary | ICD-10-CM | POA: Insufficient documentation

## 2024-05-26 NOTE — Progress Notes (Signed)
  Radiation Oncology         [phone]) [phone] ________________________________  Name: Luke Becker MRN: [id number]  Date of Service: 05/26/2024  DOB: Apr 21, 1946  Post Treatment Telephone Note  Diagnosis: 78 y.o. gentleman with Stage T1c adenocarcinoma of the prostate with Gleason score of 4+5, and PSA of 10.8 (adjusted for finasteride ).   Pre Treatment IPSS Score: Self- cath  The patient was not available for call today. Unable to reach patient for today's appointment. 2 calls. No answer. No voicemail. Call complete.  Patient has a scheduled follow up visit with his urologist, Dr. Selma, on 06/2024  for ongoing surveillance. He was counseled that PSA levels will be drawn in the urology office, and was reassured that additional time is expected to improve bowel and bladder symptoms. He was encouraged to call back with concerns or questions regarding radiation.    Rosaline Minerva, LPN

## 2024-06-26 ENCOUNTER — Encounter: Payer: Self-pay | Admitting: *Deleted

## 2024-06-29 ENCOUNTER — Encounter: Payer: Self-pay | Admitting: *Deleted

## 2024-06-29 NOTE — Progress Notes (Signed)
 SCP completed and mailed to patient.

## 2024-07-08 ENCOUNTER — Inpatient Hospital Stay: Attending: Internal Medicine | Admitting: *Deleted

## 2024-07-08 ENCOUNTER — Encounter: Payer: Self-pay | Admitting: *Deleted

## 2024-07-08 DIAGNOSIS — C61 Malignant neoplasm of prostate: Secondary | ICD-10-CM

## 2024-07-08 NOTE — Progress Notes (Addendum)
 SCP reviewed and completed. Allergies and meds were reviewed and updated. Pt denies pain today. Fatigue he rates a 3/10. Pt denies smoking and drinking. Pt most complained of the hotflashes he has from taking the Orgovyx. Pt is in [place]  at this time. 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 Nutrition and diet were discussed and I shared with patient some of our resources that he can access virtually. Pt self - caths daily and at night before he goes to bed and then again around 5a when he's getting up. Pt has an appt in Sept w/ Dr.Gay. A copy of prostate summary will be sent to PCP.
